# Patient Record
Sex: Male | Born: 1955 | Hispanic: No | Marital: Married | Smoking: Never smoker
Health system: Southern US, Community
[De-identification: ages and names within clinical notes are randomized; demographics above are authoritative.]

## PROBLEM LIST (undated history)

## (undated) DIAGNOSIS — K219 Gastro-esophageal reflux disease without esophagitis: Secondary | ICD-10-CM

## (undated) DIAGNOSIS — R569 Unspecified convulsions: Secondary | ICD-10-CM

## (undated) DIAGNOSIS — H269 Unspecified cataract: Secondary | ICD-10-CM

## (undated) DIAGNOSIS — G709 Myoneural disorder, unspecified: Secondary | ICD-10-CM

## (undated) DIAGNOSIS — G4730 Sleep apnea, unspecified: Secondary | ICD-10-CM

## (undated) DIAGNOSIS — M1990 Unspecified osteoarthritis, unspecified site: Secondary | ICD-10-CM

## (undated) DIAGNOSIS — R011 Cardiac murmur, unspecified: Secondary | ICD-10-CM

## (undated) HISTORY — DX: Sleep apnea, unspecified: G47.30

## (undated) HISTORY — DX: Cardiac murmur, unspecified: R01.1

## (undated) HISTORY — PX: HERNIA REPAIR: SHX51

## (undated) HISTORY — DX: Gastro-esophageal reflux disease without esophagitis: K21.9

## (undated) HISTORY — PX: KNEE SURGERY: SHX244

## (undated) HISTORY — DX: Unspecified cataract: H26.9

## (undated) HISTORY — DX: Unspecified convulsions: R56.9

## (undated) HISTORY — DX: Myoneural disorder, unspecified: G70.9

## (undated) HISTORY — DX: Unspecified osteoarthritis, unspecified site: M19.90

## (undated) HISTORY — PX: POLYPECTOMY: SHX149

---

## 2001-05-13 ENCOUNTER — Encounter: Payer: Self-pay | Admitting: Pulmonary Disease

## 2001-06-16 ENCOUNTER — Encounter: Payer: Self-pay | Admitting: Pulmonary Disease

## 2001-07-04 ENCOUNTER — Encounter: Payer: Self-pay | Admitting: Pulmonary Disease

## 2005-06-29 ENCOUNTER — Ambulatory Visit: Payer: Self-pay | Admitting: Gastroenterology

## 2005-07-20 ENCOUNTER — Ambulatory Visit: Payer: Self-pay | Admitting: Gastroenterology

## 2005-07-20 HISTORY — PX: COLONOSCOPY: SHX174

## 2008-02-27 ENCOUNTER — Telehealth (INDEPENDENT_AMBULATORY_CARE_PROVIDER_SITE_OTHER): Payer: Self-pay | Admitting: *Deleted

## 2008-03-16 ENCOUNTER — Ambulatory Visit: Payer: Self-pay | Admitting: Pulmonary Disease

## 2008-03-16 DIAGNOSIS — G4733 Obstructive sleep apnea (adult) (pediatric): Secondary | ICD-10-CM | POA: Insufficient documentation

## 2008-03-16 DIAGNOSIS — Z9889 Other specified postprocedural states: Secondary | ICD-10-CM | POA: Insufficient documentation

## 2008-09-20 ENCOUNTER — Encounter: Payer: Self-pay | Admitting: Pulmonary Disease

## 2008-09-24 ENCOUNTER — Encounter: Payer: Self-pay | Admitting: Pulmonary Disease

## 2009-04-26 ENCOUNTER — Telehealth (INDEPENDENT_AMBULATORY_CARE_PROVIDER_SITE_OTHER): Payer: Self-pay | Admitting: *Deleted

## 2009-04-27 ENCOUNTER — Ambulatory Visit: Payer: Self-pay | Admitting: Cardiology

## 2009-04-27 ENCOUNTER — Ambulatory Visit: Payer: Self-pay

## 2009-04-27 ENCOUNTER — Encounter (HOSPITAL_COMMUNITY): Admission: RE | Admit: 2009-04-27 | Discharge: 2009-06-08 | Payer: Self-pay | Admitting: Internal Medicine

## 2010-04-26 ENCOUNTER — Encounter: Payer: Self-pay | Admitting: Pulmonary Disease

## 2010-06-28 ENCOUNTER — Telehealth (INDEPENDENT_AMBULATORY_CARE_PROVIDER_SITE_OTHER): Payer: Self-pay | Admitting: *Deleted

## 2010-07-11 NOTE — Letter (Signed)
Summary: CMN request for CPAP Supplies/[facility]  CMN request for CPAP Supplies/[facility]   Imported By: Sherian Rein 05/19/2010 12:39:19  _____________________________________________________________________  External Attachment:    Type:   Image     Comment:   External Document

## 2010-07-13 NOTE — Progress Notes (Addendum)
Summary: switch doctors--  Phone Note Call from Patient Call back at [facility] Phone [phone]   Caller: Patient Call For: sood Reason for Call: Talk to Nurse Summary of Call: Patient is asking to switch doctors from Dr. Craige Cotta to Dr .Vassie Loll.  Patient did not have a particular reason why. Initial call taken by: Lehman Prom,  June 28, 2010 2:51 PM  Follow-up for Phone Call        Dr Craige Cotta, pls advise if this is okay with you, thanks Vernie Murders  June 28, 2010 3:48 PM   Additional Follow-up for Phone Call Additional follow up Details #1::        I have not seen Mr. Brizzi since 2009.  That is fine for him to switch to Dr. Vassie Loll. Additional Follow-up by: Coralyn Helling MD,  June 28, 2010 4:00 PM    Additional Follow-up for Phone Call Additional follow up Details #2::    Dr. Vassie Loll please advise if okay with you. Thanks. [name] CMA  June 28, 2010 4:04 PM   Pl let him know that Dr Craige Cotta is an excellent doctor, he only needs once a year FU for his obstructive sleep apnea . No need to switch unless he can provide overriding reason. Follow-up by: Comer Locket Vassie Loll MD,  June 29, 2010 11:09 PM  Additional Follow-up for Phone Call Additional follow up Details #3:: Details for Additional Follow-up Action Taken: [facility] x1. Abigail Miyamoto RN  June 30, 2010 9:32 AM   LMTCB x2 Vernie Murders  July 04, 2010 12:28 PM returnin call .Marland KitchenChantel Bowne  July 04, 2010 4:14 PM  Spoke with pt.  He states he just feels that Dr. Vassie Loll would better serve his needs as a patient.  Dr. Vassie Loll, would you still rather pt stay with [facility]? Gweneth Dimitri RN  July 04, 2010 5:27 PM  YES  Spoke with pt. I advised that RA decided it would be best if he stayed with [facility].  Pt did not understand why.  I advised that there was never any specific reason given for the switch.  He states that he looked on the internet and found out that [facility] was not a sleep specialist.  I advised that this is false  information, I believe there is another MD out there with the name of Coralyn Helling, and advised that maybe he was looking at the wrong physicians info.  He states that this is not the case and still wants to see RA.  I advised that there are other sleep docs here and that maybe I could ask them if they are willing to take him on and he declined stating that his "insurance reccomended Dr Vassie Loll for his specific problem" which apparently is OSA.  He states that he will talk with PCP and be reffered to a different facility. Will forward to [facility] so that he is aware.  Vernie Murders  July 05, 2010 12:51 PM Coralyn Helling MD  July 05, 2010 2:00 PM  Additional Follow-up by: Comer Locket. Vassie Loll MD,  July 05, 2010 9:21 AM  [facility].Michel Bickers [facility]  July 05, 2010 9:37 AM    Appended Document: switch doctors-- d/w  Dr Craige Cotta, I can see this patient & discuss his problem - 15 mins FU OV pl  Appended Document: switch doctors-- lmomtcb  Appended Document: switch doctors-- pt set to see Dr. Vassie Loll monday 07-17-10 at 3:15pm.

## 2010-07-17 ENCOUNTER — Encounter: Payer: Self-pay | Admitting: Pulmonary Disease

## 2010-07-17 ENCOUNTER — Ambulatory Visit (INDEPENDENT_AMBULATORY_CARE_PROVIDER_SITE_OTHER): Payer: BC Managed Care – PPO | Admitting: Pulmonary Disease

## 2010-07-17 DIAGNOSIS — G4733 Obstructive sleep apnea (adult) (pediatric): Secondary | ICD-10-CM

## 2010-07-20 ENCOUNTER — Telehealth: Payer: Self-pay | Admitting: Pulmonary Disease

## 2010-07-25 ENCOUNTER — Telehealth (INDEPENDENT_AMBULATORY_CARE_PROVIDER_SITE_OTHER): Payer: Self-pay | Admitting: *Deleted

## 2010-07-27 NOTE — Progress Notes (Signed)
  Phone Note Call from Patient   Caller: [email or web address] Call For: Alexander George Summary of Call: pt was there for mask fit and stated his pressure on 6-12 is not enough can you send [facility] an order to start it@7 -12 Initial call taken by: Oneita Jolly,  July 20, 2010 3:32 PM

## 2010-07-27 NOTE — Assessment & Plan Note (Signed)
Summary: sleep apnea//switch from Dr. Edrick Oh   Primary Provider/Referring Provider:  Mila Homer  CC:  Pt changed from [facility] due to insurance .  History of Present Illness: Alexander George, for evaluation of obstructive sleep apnea . He was diagnosed with sleep apnea in 2003.  His RDI was 21 with a REM RDI of 57.  His oxygen nadir was 76%.  He has been on CPAP since then.  He uses nasal pillows and has a humidifer. He has a rotating shift with his work, and as a result sleeps at different times of the day.  He gets about 6 to 7 hours of sleep per day.  Download in 2009 showed residual AHI of 13/h  July 17, 2010 3:44 PM  Switching doctors from VS to me  Poor compliance with CPAP-  he is frustrated that he has tihs problem even though he is not obese, wife has continued to observe loud snoring & witnessed apneas , even when he sleeps in a different room. Download shows AHI of 15/ h on pr of 6 cm, poor usage. he does not use sleep aids & denies excessive daytime somnolence , other than when he changes shifts. Did not tolerate full face mask, has never tried nasal mask There is no history suggestive of cataplexy, sleep paralysis or parasomnias    Preventive Screening-Counseling & Management  Alcohol-Tobacco     Alcohol drinks/day: 0     Smoking Status: never   History of Present Illness: Sleep Apnea  What time do you typically go to bed?(between what hours): varies  How long does it take you to fall asleep? varies (long time with CPAP)  How many times during the night do you wake up? none  What time do you get out of bed to start your day? varies  Do you drive or operate heavy machinery in your occupation? sometimes  How much has your weight changed (up or down) over the past two years? (in pounds): 0  Have you ever had a sleep study before?  If yes,when and where: 2003, [place]  Do you currently use CPAP ? If so , at what pressure? yes, 6  Do you wear oxygen at any time? If yes,  how many liters per minute? no Current Medications (verified): 1)  No Daily Meds 2)  Testosterone .... 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   Social History: Alcohol drinks/day:  0  Review of Systems       The patient complains of acid heartburn, indigestion, nasal congestion/difficulty breathing through nose, hand/feet swelling, and rash.  The patient denies shortness of breath with activity, shortness of breath at rest, productive cough, non-productive cough, coughing up blood, chest pain, irregular heartbeats, loss of appetite, weight change, abdominal pain, difficulty swallowing, sore throat, tooth/dental problems, headaches, sneezing, itching, ear ache, anxiety, depression, joint stiffness or pain, change in color of mucus, and fever.    Vital Signs:  Patient profile:   55 year old male Height:      67 inches Weight:      183 pounds BMI:     28.77 O2 Sat:      96 % on Room air Temp:     98.0 degrees F oral Pulse rate:   78 / minute BP sitting:  120 / 80  (left arm) Cuff size:   regular  Vitals Entered By: [name] CMA (July 17, 2010 3:23 PM)  O2 Flow:  Room air CC: Pt changed from [facility] due to insurance  Comments Medications reviewed with patient Verified contact number and pharmacy with patient [name] [facility]  July 17, 2010 3:23 PM    Physical Exam  Additional Exam:  wt 183 July 17, 2010  Gen. Pleasant, well-nourished, in no distress, normal affect ENT - no lesions, no post nasal drip, class 2 airway, deviated septum Neck: No JVD, no thyromegaly, no carotid bruits Lungs: no use of accessory muscles, no dullness to percussion, clear without rales or rhonchi  Cardiovascular: Rhythm  regular, heart sounds  normal, no murmurs or gallops, no peripheral edema Abdomen: soft and non-tender, no hepatosplenomegaly, BS normal. Musculoskeletal: No deformities, no cyanosis or clubbing     Impression & Recommendations:  Problem # 1:  OBSTRUCTIVE SLEEP APNEA (ICD-327.23) The pathophysiology of obstructive sleep apnea, it's cardiovascular consequences and modes of treatment including CPAP were discussed with the patient in great detail. I feel his obstruction is mainly nasal - but doubt that sweptal surgery wil be curative. Discussed oral appliance but of limited benefit given moderate degree of obstructive sleep apnea  Increase CPAP to auto 6-12 & rechk download, trial of nasal mask with humidity ENcouraged compliance trial of nasal steroids Orders: Est. Patient Level IV (04540) DME Referral (DME)  Medications Added to Medication List This Visit: 1)  Testosterone  .... Im injections  Patient Instructions: 1)  Copy sent to: Dr guest 2)  Please schedule a follow-up appointment in 1 month. 3)  nasal spray each nare at bedtime  4)  Download from CPAP for pressure check 5)  Trial of nasal mask - [id number] - go over to the sleep lab

## 2010-08-08 NOTE — Progress Notes (Signed)
Summary: pt would like the nurse to call him regarding his cpap  Phone Note Call from Patient   Caller: Patient Call For: alva Summary of Call: patient phoned regarding his CPAP machine he would like for the nurse to call him back. He can be reached at [phone]. He isnt able to use it and he has to return to dr Vassie Loll in one month to elvaluate.  Initial call taken by: Vedia Coffer,  July 25, 2010 1:13 PM  Follow-up for Phone Call        Spoke with pt.  He states unable to tolerate CPAP- feels like he is choking and there is too much pressure.  He states that he wanted to know how to adjust his pressure and [facility] advised him would need order from [place] to show him how to do this.  Pls advise thanks Follow-up by: Vernie Murders,  July 25, 2010 2:29 PM  Additional Follow-up for Phone Call Additional follow up Details #1::        order sent to lower pressure Additional Follow-up by: Comer Locket. Vassie Loll MD,  July 25, 2010 3:01 PM    Additional Follow-up for Phone Call Additional follow up Details #2::    Called and spoke with pt and informed him we were sending an order to [facility] to lower pressure. Pt states he does not want his pressure lowered that he just wants [facility] to show him how to adjust his pressure himself on his machine.  Carver Fila  July 25, 2010 3:07 PM   let him know he is at the lowest pressure already on auto setting - the  machine adjusts automatically  he does not have to manually do this. I will call him if he still has questions. Follow-up by: Comer Locket Vassie Loll MD,  July 25, 2010 5:13 PM  Additional Follow-up for Phone Call Additional follow up Details #3:: Details for Additional Follow-up Action Taken: [facility] Vernie Murders  July 25, 2010 5:20 PM [facility] x 2. [name] [facility]  July 28, 2010 3:43 PM  [facility] Gweneth Dimitri RN  July 28, 2010 5:29 PM [email or web address] Carver Fila  July 31, 2010 5:13 PM   Called, spoke with pt.  He was informed of above  per RA.  States he is already aware of this.  States he had another questiongs but answered it by himself by googling it.  States he has no problems anymore.  No further questions.  Asked if he would like to speak to RA personally, he said no he has nothing else to ask. Additional Follow-up by: Gweneth Dimitri RN,  July 31, 2010 5:21 PM

## 2010-08-24 ENCOUNTER — Ambulatory Visit (INDEPENDENT_AMBULATORY_CARE_PROVIDER_SITE_OTHER): Payer: BC Managed Care – PPO | Admitting: Pulmonary Disease

## 2010-08-24 ENCOUNTER — Encounter: Payer: Self-pay | Admitting: Pulmonary Disease

## 2010-08-24 DIAGNOSIS — G4733 Obstructive sleep apnea (adult) (pediatric): Secondary | ICD-10-CM

## 2010-08-29 NOTE — Assessment & Plan Note (Signed)
Summary: 1 month rov//sh   Visit Type:  Follow-up Primary Provider/Referring Provider:  Mila Homer  CC:  OSA follow up.Marland Kitchen  History of Present Illness: 55/M, for evaluation of obstructive sleep apnea . He was diagnosed with sleep apnea in 2003.  His RDI was 21 with a REM RDI of 57.  His oxygen nadir was 76%.  He has been on CPAP since then.  He uses nasal pillows and has a humidifer. He has a rotating shift with his work, and as a result sleeps at different times of the day.  He gets about 6 to 7 hours of sleep per day.  July 17, 2010 3:44 PM  Switching doctors from [facility] to me  Poor compliance with CPAP-  he is frustrated that he has tihs problem even though he is not obese, wife has continued to observe loud snoring & witnessed apneas , even when he sleeps in a different room. Download shows AHI of 15/ h on pr of 6 cm, poor usage.   August 24, 2010 3:08 PM  Compliance poor, auto CPAP download shows >> mean pr 10 cm. he does not use sleep aids & denies excessive daytime somnolence , other than when he changes shifts. Did not tolerate full face mask, has never tried nasal mask There is no history suggestive of cataplexy, sleep paralysis or parasomnias    Preventive Screening-Counseling & Management  Alcohol-Tobacco     Alcohol drinks/day: 0     Smoking Status: never  Caffeine-Diet-Exercise     Exercise (avg: min/session): 10:31  Current Medications (verified): 1)  No Daily Meds 2)  Testosterone .... 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   Review of Systems  The patient denies anorexia, fever, weight loss, weight gain, vision loss, decreased hearing, hoarseness, chest pain, syncope, dyspnea on exertion,  peripheral edema, prolonged cough, headaches, hemoptysis, abdominal pain, melena, hematochezia, severe indigestion/heartburn, hematuria, muscle weakness, suspicious skin lesions, transient blindness, difficulty walking, depression, unusual weight change, abnormal bleeding, enlarged lymph nodes, and angioedema.    Vital Signs:  Patient profile:   55 year old male Height:      67 inches Weight:      179 pounds BMI:     28.14 O2 Sat:      96 % on Room air Temp:     98.1 degrees F oral Pulse rate:   78 / minute BP sitting:   102 / 60  (left arm) Cuff size:   regular  Vitals Entered By: [name] CMA (August 24, 2010 2:35 PM)  O2 Flow:  Room air CC: OSA follow up. Comments Medications reviewed with patient Verified contact number and pharmacy with patient [name] [facility]  August 24, 2010 2:37 PM    Physical Exam  Additional Exam:  wt 183 July 17, 2010 >> 179 August 25, 2010  Gen. Pleasant, well-nourished, in no distress, normal affect ENT - no lesions, no post nasal drip, class 2 airway, deviated septum Neck: No JVD, no thyromegaly, no carotid bruits Lungs: no use of accessory muscles, no dullness to percussion, clear without rales or rhonchi  Cardiovascular: Rhythm regular, heart sounds  normal, no murmurs or gallops, no peripheral edema Abdomen: soft and non-tender, no hepatosplenomegaly, BS normal. Musculoskeletal: No deformities, no cyanosis or clubbing  Impression & Recommendations:  Problem # 1:  OBSTRUCTIVE SLEEP APNEA (ICD-327.23)  I spent some time with him addressing his frustartio with CPPA & discussing alternatives I feel his obstruction is mainly nasal - but doubt that sweptal surgery wil be curative. Discussed oral appliance &have saked him to conatact Dr Myrtis Ser to discuss how to proceed  cost involved Increase CPAP to 8 cm - will accept residual AHIs on this ENcouraged compliance  Orders: Est. Patient Level III (04540)  Patient  Instructions: 1)  Copy sent to: 2)  Please schedule a follow-up appointment in 6 months. 3)  Persist with CPAP 8 cm  4)  Meet  with Dr Myrtis Ser - dental specialist for obstructive sleep apnea  5)  Call me if you have this made

## 2010-09-01 ENCOUNTER — Encounter: Payer: Self-pay | Admitting: Pulmonary Disease

## 2010-12-26 ENCOUNTER — Telehealth: Payer: Self-pay | Admitting: Pulmonary Disease

## 2010-12-26 NOTE — Telephone Encounter (Signed)
His dentist will have to do get authorisation.We do not  do this .

## 2010-12-26 NOTE — Telephone Encounter (Signed)
Pt says he wants to proceed with getting a dental appliance but says we need to get authorization through his insurance company first because this will fall under his medical and not dental plan. Pls advise.

## 2010-12-26 NOTE — Telephone Encounter (Signed)
Spoke with pt and notified of recs per RA. He does not seem to understanding. He states that his insurance co said to call here, b/c the appliance is not considered dental, it is medical. He states that he never even was seen by the dentist. He was not making sense. I tried explaining to him again to have dentist contact ins. He does not understand. RA, pls advise thanks

## 2010-12-29 NOTE — Telephone Encounter (Signed)
Dr. Vassie Loll spoke with the pt and explained procedure.Carron Curie, CMA

## 2010-12-29 NOTE — Telephone Encounter (Signed)
I have tried to reach him at the home no.. Pl have him leave a cell no where I can reach him.

## 2011-01-05 ENCOUNTER — Other Ambulatory Visit: Payer: Self-pay | Admitting: Emergency Medicine

## 2011-01-05 DIAGNOSIS — R590 Localized enlarged lymph nodes: Secondary | ICD-10-CM

## 2011-01-08 ENCOUNTER — Other Ambulatory Visit: Payer: BC Managed Care – PPO

## 2011-01-08 ENCOUNTER — Telehealth: Payer: Self-pay | Admitting: Pulmonary Disease

## 2011-01-08 NOTE — Telephone Encounter (Signed)
Spoke to pt and he states 10 days ago dr Vassie Loll called him and told him he was sending over paperwork to dr Myrtis Ser for his appt for a dental appliance, pt had his appt today and dr Myrtis Ser did not have any info--please advise about what needs to be done or what records need to be sent.

## 2011-01-08 NOTE — Telephone Encounter (Signed)
Last ov note and PSG were faxed to Katy's attn at the fax number provided.

## 2011-01-08 NOTE — Telephone Encounter (Signed)
Pl send last office note & sleep study results

## 2011-01-09 NOTE — Telephone Encounter (Signed)
LMTCB

## 2011-01-09 NOTE — Telephone Encounter (Signed)
There was not an order or referral in epic for pt to see Dr. Myrtis Ser. That's why referral wasn't faxed to Dr. Henrietta Hoover office. Hand wrote referral and faxed records to Dr. Myrtis Ser.

## 2011-01-11 NOTE — Telephone Encounter (Signed)
lmomtcb  

## 2011-01-12 NOTE — Telephone Encounter (Signed)
Pt return call. Krista L Lilly  °

## 2011-01-12 NOTE — Telephone Encounter (Signed)
LMTCBx1.Jennifer Castillo, CMA  

## 2011-01-15 NOTE — Telephone Encounter (Signed)
Everything is taken care of for this pt and he is not looking for anything further this was about the ref. To dr Myrtis Ser and this has been done

## 2011-04-26 ENCOUNTER — Other Ambulatory Visit: Payer: Self-pay | Admitting: Emergency Medicine

## 2011-04-26 DIAGNOSIS — R221 Localized swelling, mass and lump, neck: Secondary | ICD-10-CM

## 2011-05-01 ENCOUNTER — Ambulatory Visit
Admission: RE | Admit: 2011-05-01 | Discharge: 2011-05-01 | Disposition: A | Discharge status: Home / Self Care | Payer: BC Managed Care – PPO | Source: Ambulatory Visit | Attending: Emergency Medicine | Admitting: Emergency Medicine

## 2011-05-01 DIAGNOSIS — R221 Localized swelling, mass and lump, neck: Secondary | ICD-10-CM

## 2011-06-13 ENCOUNTER — Ambulatory Visit
Admission: RE | Admit: 2011-06-13 | Discharge: 2011-06-13 | Disposition: A | Discharge status: Home / Self Care | Payer: BC Managed Care – PPO | Source: Ambulatory Visit | Attending: Emergency Medicine | Admitting: Emergency Medicine

## 2011-06-13 MED ORDER — IOHEXOL 300 MG/ML  SOLN
75.0000 mL | Freq: Once | INTRAMUSCULAR | Status: AC | PRN
  Administered 2011-06-13: 75 mL via INTRAVENOUS

## 2011-06-28 ENCOUNTER — Ambulatory Visit (INDEPENDENT_AMBULATORY_CARE_PROVIDER_SITE_OTHER): Payer: BC Managed Care – PPO

## 2011-06-28 DIAGNOSIS — IMO0002 Reserved for concepts with insufficient information to code with codable children: Secondary | ICD-10-CM

## 2011-06-28 DIAGNOSIS — M542 Cervicalgia: Secondary | ICD-10-CM

## 2012-04-10 ENCOUNTER — Ambulatory Visit (INDEPENDENT_AMBULATORY_CARE_PROVIDER_SITE_OTHER): Payer: BC Managed Care – PPO | Admitting: Emergency Medicine

## 2012-04-10 VITALS — BP 124/70 | HR 69 | Temp 98.4°F | Resp 16 | Ht 68.8 in | Wt 177.0 lb

## 2012-04-10 DIAGNOSIS — M542 Cervicalgia: Secondary | ICD-10-CM

## 2012-04-10 NOTE — Progress Notes (Signed)
  Subjective:    Patient ID: Alexander George, male    DOB: [birth year], 56 y.o.   MRN: [id number]  HPI is a long history of neck problems. He is having pain in his neck with radicular symptoms down his left arm involving his index finger of the left hand. He notes that when he extends his neck he has significant discomfort in    Review of Systems     Objective:   Physical Exam is no tenderness over the C-spine. There is discomfort with extension of the neck. Deep tender reflexes are 2+ in the biceps trace brachioradialis 2+ triceps        Assessment & Plan:  Patient had radicular symptoms down his left arm. He requests referral to a specialist to evaluate this. Referral made to [facility] the neurosurgical to Dr. Lovell Sheehan to evaluate this

## 2012-05-21 ENCOUNTER — Other Ambulatory Visit (HOSPITAL_COMMUNITY): Payer: Self-pay | Admitting: Neurosurgery

## 2012-05-21 DIAGNOSIS — M542 Cervicalgia: Secondary | ICD-10-CM

## 2012-05-22 ENCOUNTER — Ambulatory Visit (HOSPITAL_COMMUNITY): Payer: BC Managed Care – PPO

## 2012-05-27 ENCOUNTER — Ambulatory Visit (HOSPITAL_COMMUNITY)
Admission: RE | Admit: 2012-05-27 | Discharge: 2012-05-27 | Disposition: A | Discharge status: Home / Self Care | Payer: BC Managed Care – PPO | Source: Ambulatory Visit | Attending: Neurosurgery | Admitting: Neurosurgery

## 2012-05-27 DIAGNOSIS — M542 Cervicalgia: Secondary | ICD-10-CM

## 2012-05-27 DIAGNOSIS — M47812 Spondylosis without myelopathy or radiculopathy, cervical region: Secondary | ICD-10-CM | POA: Insufficient documentation

## 2012-05-27 DIAGNOSIS — M4802 Spinal stenosis, cervical region: Secondary | ICD-10-CM | POA: Insufficient documentation

## 2012-05-27 DIAGNOSIS — M5412 Radiculopathy, cervical region: Secondary | ICD-10-CM | POA: Insufficient documentation

## 2013-04-02 ENCOUNTER — Ambulatory Visit: Payer: BC Managed Care – PPO

## 2013-04-02 ENCOUNTER — Ambulatory Visit (INDEPENDENT_AMBULATORY_CARE_PROVIDER_SITE_OTHER): Payer: BC Managed Care – PPO | Admitting: Emergency Medicine

## 2013-04-02 VITALS — BP 104/62 | HR 74 | Temp 98.5°F | Resp 18 | Ht 68.5 in | Wt 178.2 lb

## 2013-04-02 DIAGNOSIS — R21 Rash and other nonspecific skin eruption: Secondary | ICD-10-CM

## 2013-04-02 DIAGNOSIS — R0789 Other chest pain: Secondary | ICD-10-CM

## 2013-04-02 DIAGNOSIS — Z23 Encounter for immunization: Secondary | ICD-10-CM

## 2013-04-02 DIAGNOSIS — R1310 Dysphagia, unspecified: Secondary | ICD-10-CM

## 2013-04-02 MED ORDER — BETAMETHASONE DIPROPIONATE AUG 0.05 % EX CREA: TOPICAL_CREAM | Freq: Two times a day (BID) | CUTANEOUS | Status: DC

## 2013-04-02 MED ORDER — OMEPRAZOLE 40 MG PO CPDR: 40.0000 mg | DELAYED_RELEASE_CAPSULE | Freq: Every day | ORAL | Status: DC

## 2013-04-02 NOTE — Progress Notes (Signed)
This chart was scribed for Lesle Chris, MD by Ardelia Mems, Scribe. This patient was seen in room Room 8 and the patient's care was started at 2:17 PM.  Subjective:    Patient ID: Alexander George, male    DOB: 1956-01-06, 57 y.o.   MRN: [id number]  HPI  HPI Comments: Alexander George is a 57 y.o. male who presents to the Emergency Department complaining of 1 month of difficulty swallowing. He states that about 1 month ago, while swallowing toasted bread, he felt food get caught in his upper GI. He states that this caused him significant pain at that time, and that he has had multiple recurrences of similar episodes in the past month. He states that his difficulty swallowing has been worst with very sour and very sweet foods. He denies having particular difficulty with swallowing meats. He denies emesis. He states that he has not had a CXR "in a long time". He also has a red, scaly rash in her perineal area that itches intemittently which he would like to have checked today.   Review of Systems  HENT: Positive for trouble swallowing (pain with swallowing).   Gastrointestinal: Negative for vomiting.       Objective:   Physical Exam chest is clear to auscultation and percussion. Heart regular rate no murmurs or gallops. Abdomen soft nontender. [facility] reading (PRIMARY) by  Dr. Cleta Alberts no acute disease KOH negative      Assessment & Plan:   We'll put on Prilosec 40 mg a day referral made to Dr. Elnoria Howard for consideration for endoscopy. He was given Diprolene AF to use for the rash in his scrotal area. Flu Shot given

## 2013-04-03 ENCOUNTER — Other Ambulatory Visit: Payer: Self-pay | Admitting: *Deleted

## 2013-04-03 DIAGNOSIS — R21 Rash and other nonspecific skin eruption: Secondary | ICD-10-CM

## 2013-04-03 MED ORDER — BETAMETHASONE DIPROPIONATE AUG 0.05 % EX CREA: TOPICAL_CREAM | Freq: Two times a day (BID) | CUTANEOUS | Status: DC

## 2013-05-13 ENCOUNTER — Ambulatory Visit (INDEPENDENT_AMBULATORY_CARE_PROVIDER_SITE_OTHER): Payer: BC Managed Care – PPO | Admitting: Emergency Medicine

## 2013-05-13 ENCOUNTER — Ambulatory Visit: Payer: BC Managed Care – PPO

## 2013-05-13 VITALS — BP 112/66 | HR 74 | Temp 98.1°F | Resp 16 | Ht 69.0 in | Wt 179.2 lb

## 2013-05-13 DIAGNOSIS — M25519 Pain in unspecified shoulder: Secondary | ICD-10-CM

## 2013-05-13 DIAGNOSIS — M25512 Pain in left shoulder: Secondary | ICD-10-CM

## 2013-05-13 MED ORDER — HYDROCODONE-ACETAMINOPHEN 5-325 MG PO TABS: 1.0000 | ORAL_TABLET | Freq: Four times a day (QID) | ORAL | Status: DC | PRN

## 2013-05-13 MED ORDER — PREDNISONE 10 MG PO TABS: ORAL_TABLET | ORAL | Status: DC

## 2013-05-13 NOTE — Progress Notes (Signed)
   Subjective:    Patient ID: Alexander George, male    DOB: 1955-07-20, 57 y.o.   MRN: [id number]  HPI patient has a one week history of pain in his left shoulder. 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 He has no previous history of shoulder problems. He does not feel weak in his arm.    Review of Systems     Objective:   Physical Exam is tenderness over the left shoulder. He has good range of motion of the left shoulder. Motor strength is 5 out of 5. He had no weakness on rotator cuff testing.  [facility] reading (PRIMARY) by  Dr. Cleta Alberts normal        Assessment & Plan:  I suspect this is a bursitis of his left shoulder. we'll treat with prednisone, Vicodin for severe pain. if he continues to have problems we'll try a topical anti-inflammatory. He has had some GI problems will avoid oral nonsteroidal

## 2013-07-27 ENCOUNTER — Ambulatory Visit (INDEPENDENT_AMBULATORY_CARE_PROVIDER_SITE_OTHER): Payer: BC Managed Care – PPO | Admitting: Emergency Medicine

## 2013-07-27 ENCOUNTER — Ambulatory Visit: Payer: BC Managed Care – PPO

## 2013-07-27 VITALS — BP 100/60 | HR 66 | Temp 97.8°F | Resp 16 | Ht 68.5 in | Wt 175.0 lb

## 2013-07-27 DIAGNOSIS — M549 Dorsalgia, unspecified: Secondary | ICD-10-CM

## 2013-07-27 LAB — POCT URINALYSIS DIPSTICK
Bilirubin, UA: NEGATIVE
Blood, UA: NEGATIVE
Glucose, UA: NEGATIVE
LEUKOCYTES UA: NEGATIVE
Nitrite, UA: NEGATIVE
PH UA: 5.5
Protein, UA: NEGATIVE
Spec Grav, UA: >=1.03
Urobilinogen, UA: 0.2

## 2013-07-27 LAB — POCT UA - MICROSCOPIC ONLY
Bacteria, U Microscopic: NEGATIVE
CRYSTALS, UR, HPF, POC: NEGATIVE
Casts, Ur, LPF, POC: NEGATIVE
MUCUS UA: NEGATIVE
RBC, urine, microscopic: NEGATIVE
YEAST UA: NEGATIVE

## 2013-07-27 MED ORDER — CYCLOBENZAPRINE HCL 10 MG PO TABS: ORAL_TABLET | ORAL | Status: DC

## 2013-07-27 NOTE — Progress Notes (Signed)
   Subjective:    Patient ID: Alexander George, male    DOB: [birth year], 58 y.o.   MRN: [id number]  HPI 58 y.o. Male presents to clinic with right side back pain that started last week. Denies any trouble with breathing or when urinating. Unable to remember if he lifted something that strained himself. Reports that he does a lot of lifting, bending , crawling at work. Has some pain when reaching above head and stretching.   Review of Systems     Objective:   Physical Exam patient is alert and cooperative he does not appear in distress. His chest was clear there is tenderness along the right lower thoracic paraspinal muscles. There is some tenderness just below the right scapula. There is no tenderness over the lower lumbar spine and no CVA pain.  Results for orders placed in visit on 07/27/13  POCT URINALYSIS DIPSTICK      Result Value Ref Range   Color, UA yellow     Clarity, UA clear     Glucose, UA neg     Bilirubin, UA neg     Ketones, UA trace     Spec Grav, UA >=1.030     Blood, UA neg     pH, UA 5.5     Protein, UA neg     Urobilinogen, UA 0.2     Nitrite, UA neg     Leukocytes, UA Negative    POCT UA - MICROSCOPIC ONLY      Result Value Ref Range   WBC, Ur, HPF, POC 0-1     RBC, urine, microscopic neg     Bacteria, U Microscopic neg     Mucus, UA neg     Epithelial cells, urine per micros 0-1     Crystals, Ur, HPF, POC neg     Casts, Ur, LPF, POC neg     Yeast, UA neg     [facility] reading (PRIMARY) by  Dr. Everlene Farrier  chest x-rays no acute disease there is degenerative disc disease L4-5 and L5-S1        Assessment & Plan:   I feel this is musculoskeletal pain. His urine is completely normal. Patient does not want to take a nonsteroidal or Tylenol so we'll treat with a muscle relaxant

## 2013-07-27 NOTE — Patient Instructions (Signed)
Back Pain, Adult Low back pain is very common. About 1 in 5 people have back pain.The cause of low back pain is rarely dangerous. The pain often gets better over time.About half of people with a sudden onset of back pain feel better in just 2 weeks. About 8 in 10 people feel better by 6 weeks.  CAUSES Some common causes of back pain include:  Strain of the muscles or ligaments supporting the spine.  Wear and tear (degeneration) of the spinal discs.  Arthritis.  Direct injury to the back. DIAGNOSIS Most of the time, the direct cause of low back pain is not known.However, back pain can be treated effectively even when the exact cause of the pain is unknown.Answering your caregiver's questions about your overall health and symptoms is one of the most accurate ways to make sure the cause of your pain is not dangerous. If your caregiver needs more information, he or she may order lab work or imaging tests (X-rays or MRIs).However, even if imaging tests show changes in your back, this usually does not require surgery. HOME CARE INSTRUCTIONS For many people, back pain returns.Since low back pain is rarely dangerous, it is often a condition that people can learn to manageon their own.   Remain active. It is stressful on the back to sit or stand in one place. Do not sit, drive, or stand in one place for more than 30 minutes at a time. Take short walks on level surfaces as soon as pain allows.Try to increase the length of time you walk each day.  Do not stay in bed.Resting more than 1 or 2 days can delay your recovery.  Do not avoid exercise or work.Your body is made to move.It is not dangerous to be active, even though your back may hurt.Your back will likely heal faster if you return to being active before your pain is gone.  Pay attention to your body when you bend and lift. Many people have less discomfortwhen lifting if they bend their knees, keep the load close to their bodies,and  avoid twisting. Often, the most comfortable positions are those that put less stress on your recovering back.  Find a comfortable position to sleep. Use a firm mattress and lie on your side with your knees slightly bent. If you lie on your back, put a pillow under your knees.  Only take over-the-counter or prescription medicines as directed by your caregiver. Over-the-counter medicines to reduce pain and inflammation are often the most helpful.Your caregiver may prescribe muscle relaxant drugs.These medicines help dull your pain so you can more quickly return to your normal activities and healthy exercise.  Put ice on the injured area.  Put ice in a plastic bag.  Place a towel between your skin and the bag.  Leave the ice on for 15-20 minutes, 03-04 times a day for the first 2 to 3 days. After that, ice and heat may be alternated to reduce pain and spasms.  Ask your caregiver about trying back exercises and gentle massage. This may be of some benefit.  Avoid feeling anxious or stressed.Stress increases muscle tension and can worsen back pain.It is important to recognize when you are anxious or stressed and learn ways to manage it.Exercise is a great option. SEEK MEDICAL CARE IF:  You have pain that is not relieved with rest or medicine.  You have pain that does not improve in 1 week.  You have new symptoms.  You are generally not feeling well. SEEK   IMMEDIATE MEDICAL CARE IF:   You have pain that radiates from your back into your legs.  You develop new bowel or bladder control problems.  You have unusual weakness or numbness in your arms or legs.  You develop nausea or vomiting.  You develop abdominal pain.  You feel faint. Document Released: 05/28/2005 Document Revised: 11/27/2011 Document Reviewed: 10/16/2010 Exit[employer] Patient Information 2014 [employer].  

## 2013-07-30 ENCOUNTER — Ambulatory Visit (INDEPENDENT_AMBULATORY_CARE_PROVIDER_SITE_OTHER): Payer: BC Managed Care – PPO | Admitting: Emergency Medicine

## 2013-07-30 VITALS — BP 110/60 | HR 80 | Temp 97.9°F | Resp 16 | Ht 69.5 in | Wt 175.0 lb

## 2013-07-30 DIAGNOSIS — M5137 Other intervertebral disc degeneration, lumbosacral region: Secondary | ICD-10-CM | POA: Insufficient documentation

## 2013-07-30 DIAGNOSIS — K644 Residual hemorrhoidal skin tags: Secondary | ICD-10-CM

## 2013-07-30 DIAGNOSIS — M549 Dorsalgia, unspecified: Secondary | ICD-10-CM

## 2013-07-30 DIAGNOSIS — K649 Unspecified hemorrhoids: Secondary | ICD-10-CM

## 2013-07-30 MED ORDER — DESOXIMETASONE 0.25 % EX OINT: 1.0000 "application " | TOPICAL_OINTMENT | Freq: Two times a day (BID) | CUTANEOUS | Status: DC

## 2013-07-30 NOTE — Progress Notes (Signed)
   Subjective:    Patient ID: Alexander George, male    DOB: 1955-09-20, 58 y.o.   MRN: [id number]  HPI patient is a tender firm area which has been increasing in size at the anus. The area has become increasingly tender and sore. He is up-to-date on his colonoscopy.    Review of Systems     Objective:   Physical Exam there is a 1 a 0.5 x 0.5 cm firm round mass at 3:00. There are no rectal masses felt.        Assessment & Plan:   patient appears to have a thrombosed internal hemorrhoid. Referral made to Gen. Surgery to see about either banding or removal. He is also requesting referral to a back specialist due to his degenerative disc disease and a referral was made to Dr. Rolena Infante with [facility] orthopedics

## 2013-07-30 NOTE — Patient Instructions (Signed)
Hemorrhoids Hemorrhoids are swollen veins around the rectum or anus. There are two types of hemorrhoids:   Internal hemorrhoids. These occur in the veins just inside the rectum. They may poke through to the outside and become irritated and painful.  External hemorrhoids. These occur in the veins outside the anus and can be felt as a painful swelling or hard lump near the anus. CAUSES  Pregnancy.   Obesity.   Constipation or diarrhea.   Straining to have a bowel movement.   Sitting for long periods on the toilet.  Heavy lifting or other activity that caused you to strain.  Anal intercourse. SYMPTOMS   Pain.   Anal itching or irritation.   Rectal bleeding.   Fecal leakage.   Anal swelling.   One or more lumps around the anus.  DIAGNOSIS  Your caregiver may be able to diagnose hemorrhoids by visual examination. Other examinations or tests that may be performed include:   Examination of the rectal area with a gloved hand (digital rectal exam).   Examination of anal canal using a small tube (scope).   A blood test if you have lost a significant amount of blood.  A test to look inside the colon (sigmoidoscopy or colonoscopy). TREATMENT Most hemorrhoids can be treated at home. However, if symptoms do not seem to be getting better or if you have a lot of rectal bleeding, your caregiver may perform a procedure to help make the hemorrhoids get smaller or remove them completely. Possible treatments include:   Placing a rubber band at the base of the hemorrhoid to cut off the circulation (rubber band ligation).   Injecting a chemical to shrink the hemorrhoid (sclerotherapy).   Using a tool to burn the hemorrhoid (infrared light therapy).   Surgically removing the hemorrhoid (hemorrhoidectomy).   Stapling the hemorrhoid to block blood flow to the tissue (hemorrhoid stapling).  HOME CARE INSTRUCTIONS   Eat foods with fiber, such as whole grains, beans,  nuts, fruits, and vegetables. Ask your doctor about taking products with added fiber in them (fibersupplements).  Increase fluid intake. Drink enough water and fluids to keep your urine clear or pale yellow.   Exercise regularly.   Go to the bathroom when you have the urge to have a bowel movement. Do not wait.   Avoid straining to have bowel movements.   Keep the anal area dry and clean. Use wet toilet paper or moist towelettes after a bowel movement.   Medicated creams and suppositories may be used or applied as directed.   Only take over-the-counter or prescription medicines as directed by your caregiver.   Take warm sitz baths for 15 20 minutes, 3 4 times a day to ease pain and discomfort.   Place ice packs on the hemorrhoids if they are tender and swollen. Using ice packs between sitz baths may be helpful.   Put ice in a plastic bag.   Place a towel between your skin and the bag.   Leave the ice on for 15 20 minutes, 3 4 times a day.   Do not use a donut-shaped pillow or sit on the toilet for long periods. This increases blood pooling and pain.  SEEK MEDICAL CARE IF:  You have increasing pain and swelling that is not controlled by treatment or medicine.  You have uncontrolled bleeding.  You have difficulty or you are unable to have a bowel movement.  You have pain or inflammation outside the area of the hemorrhoids. MAKE SURE YOU:    Understand these instructions.  Will watch your condition.  Will get help right away if you are not doing well or get worse. Document Released: 05/25/2000 Document Revised: 05/14/2012 Document Reviewed: 04/01/2012 Exit[employer] Patient Information 2014 [employer].  

## 2013-08-07 ENCOUNTER — Ambulatory Visit (INDEPENDENT_AMBULATORY_CARE_PROVIDER_SITE_OTHER): Payer: BC Managed Care – PPO | Admitting: General Surgery

## 2014-03-04 ENCOUNTER — Other Ambulatory Visit: Payer: Self-pay | Admitting: Emergency Medicine

## 2014-03-15 ENCOUNTER — Other Ambulatory Visit: Payer: Self-pay | Admitting: Emergency Medicine

## 2014-03-15 ENCOUNTER — Ambulatory Visit (INDEPENDENT_AMBULATORY_CARE_PROVIDER_SITE_OTHER): Payer: BC Managed Care – PPO

## 2014-03-15 ENCOUNTER — Ambulatory Visit (INDEPENDENT_AMBULATORY_CARE_PROVIDER_SITE_OTHER): Payer: BC Managed Care – PPO | Admitting: Emergency Medicine

## 2014-03-15 VITALS — BP 128/80 | HR 78 | Temp 97.4°F | Resp 17 | Ht 68.5 in | Wt 173.0 lb

## 2014-03-15 DIAGNOSIS — M25539 Pain in unspecified wrist: Secondary | ICD-10-CM

## 2014-03-15 DIAGNOSIS — R21 Rash and other nonspecific skin eruption: Secondary | ICD-10-CM

## 2014-03-15 DIAGNOSIS — M25529 Pain in unspecified elbow: Secondary | ICD-10-CM

## 2014-03-15 MED ORDER — DICLOFENAC SODIUM 1 % TD GEL: TRANSDERMAL | Status: DC

## 2014-03-15 MED ORDER — BETAMETHASONE DIPROPIONATE AUG 0.05 % EX CREA: TOPICAL_CREAM | Freq: Two times a day (BID) | CUTANEOUS | Status: DC

## 2014-03-15 NOTE — Patient Instructions (Signed)
Lateral Epicondylitis (Tennis Elbow) with Rehab Lateral epicondylitis involves inflammation and pain around the outer portion of the elbow. The pain is caused by inflammation of the tendons in the forearm that bring back (extend) the wrist. Lateral epicondylitis is also called tennis elbow, because it is very common in tennis players. However, it may occur in any individual who extends the wrist repetitively. If lateral epicondylitis is left untreated, it may become a chronic problem. SYMPTOMS   Pain, tenderness, and inflammation on the outer (lateral) side of the elbow.  Pain or weakness with gripping activities.  Pain that increases with wrist-twisting motions (playing tennis, using a screwdriver, opening a door or a jar).  Pain with lifting objects, including a coffee cup. CAUSES  Lateral epicondylitis is caused by inflammation of the tendons that extend the wrist. Causes of injury may include:  Repetitive stress and strain on the muscles and tendons that extend the wrist.  Sudden change in activity level or intensity.  Incorrect grip in racquet sports.  Incorrect grip size of racquet (often too large).  Incorrect hitting position or technique (usually backhand, leading with the elbow).  Using a racket that is too heavy. RISK INCREASES WITH:  Sports or occupations that require repetitive and/or strenuous forearm and wrist movements (tennis, squash, racquetball, carpentry).  Poor wrist and forearm strength and flexibility.  Failure to warm up properly before activity.  Resuming activity before healing, rehabilitation, and conditioning are complete. PREVENTION   Warm up and stretch properly before activity.  Maintain physical fitness:  Strength, flexibility, and endurance.  Cardiovascular fitness.  Wear and use properly fitted equipment.  Learn and use proper technique and have a coach correct improper technique.  Wear a tennis elbow (counterforce) brace. PROGNOSIS    The course of this condition depends on the degree of the injury. If treated properly, acute cases (symptoms lasting less than 4 weeks) are often resolved in 2 to 6 weeks. Chronic (longer lasting cases) often resolve in 3 to 6 months but may require physical therapy. RELATED COMPLICATIONS   Frequently recurring symptoms, resulting in a chronic problem. Properly treating the problem the first time decreases frequency of recurrence.  Chronic inflammation, scarring tendon degeneration, and partial tendon tear, requiring surgery.  Delayed healing or resolution of symptoms. TREATMENT  Treatment first involves the use of ice and medicine to reduce pain and inflammation. Strengthening and stretching exercises may help reduce discomfort if performed regularly. These exercises may be performed at home if the condition is an acute injury. Chronic cases may require a referral to a physical therapist for evaluation and treatment. Your caregiver may advise a corticosteroid injection to help reduce inflammation. Rarely, surgery is needed. MEDICATION  If pain medicine is needed, nonsteroidal anti-inflammatory medicines (aspirin and ibuprofen), or other minor pain relievers (acetaminophen), are often advised.  Do not take pain medicine for 7 days before surgery.  Prescription pain relievers may be given, if your caregiver thinks they are needed. Use only as directed and only as much as you need.  Corticosteroid injections may be recommended. These injections should be reserved only for the most severe cases, because they can only be given a certain number of times. HEAT AND COLD  Cold treatment (icing) should be applied for 10 to 15 minutes every 2 to 3 hours for inflammation and pain, and immediately after activity that aggravates your symptoms. Use ice packs or an ice massage.  Heat treatment may be used before performing stretching and strengthening activities prescribed by your   caregiver, physical  therapist, or athletic trainer. Use a heat pack or a warm water soak. SEEK MEDICAL CARE IF: Symptoms get worse or do not improve in 2 weeks, despite treatment

## 2014-03-15 NOTE — Progress Notes (Signed)
   Subjective:    Patient ID: Alexander George, male    DOB: 1956/03/09, 58 y.o.   MRN: [id number] This chart was scribed for Arlyss Queen, MD by Marti Sleigh, Medical Scribe. This patient was seen in Room 12 and the patient's care was started at 9:54 AM.  Wrist Pain  The pain is present in the left elbow, right elbow, right arm and left arm. This is a new problem. The current episode started more than 1 month ago. There has been no history of extremity trauma. The problem occurs daily. The problem has been waxing and waning. The quality of the pain is described as dull.   HPI Comments: Alexander George is a 58 y.o. male who presents to the Emergency Department complaining of chronic bilateral muscular arm pain that started five years ago. Pt states that the pain started radiating to his wrists within the last several months. Pt states that the pain is worse with lifting.  Pt states that he has a pinched nerve in his neck. Pt states that he is going to a chiropractor for treatment of his neck. Pt denies back pain. Pt uses augmented betamethasone dipropionate cream with for genital itching.. Pt states he needs a refill of his augmented betamethasone dipropionate cream.    Review of Systems  Constitutional: Negative for appetite change and fatigue.  HENT: Negative for congestion, ear discharge and sinus pressure.   Eyes: Negative for discharge.  Respiratory: Negative for cough.   Gastrointestinal: Negative for abdominal pain and diarrhea.  Genitourinary: Negative for frequency and hematuria.  Musculoskeletal: Positive for myalgias and neck pain. Negative for back pain.  Skin: Negative for rash.  Neurological: Negative for seizures and headaches.       Objective:   Physical Exam  Constitutional: He is oriented to person, place, and time. He appears well-developed.  HENT:  Head: Normocephalic.  Eyes: Conjunctivae and EOM are normal. No scleral icterus.  Neck: Neck supple. No thyromegaly  present.  Cardiovascular: Normal rate and regular rhythm.  Exam reveals no gallop and no friction rub.   No murmur heard. Pulmonary/Chest: No stridor. He has no wheezes. He has no rales. He exhibits no tenderness.  Abdominal: He exhibits no distension. There is no tenderness. There is no rebound.  Musculoskeletal: Normal range of motion. He exhibits no edema.       Right shoulder: He exhibits tenderness and pain. He exhibits normal strength.  Mild tenderness of the later epicondyle bilaterally. Pain with extension with resistance. Negative Phalen and tinel's tests.  Lymphadenopathy:    He has no cervical adenopathy.  Neurological: He is oriented to person, place, and time. He exhibits normal muscle tone. Coordination normal.  Skin: No rash noted. No erythema.  Psychiatric: He has a normal mood and affect. His behavior is normal.   [facility] reading (PRIMARY) by  Dr. Everlene Farrier x-rays right elbow and right wrist reveal mild degenerative changes x-rays left elbow and wrist revealed mild degenerative changes otherwise unremarkable.        Assessment & Plan:  Patient given Voltaren gel to use on his elbows and wrists as needed for inflammation. His steroid cream was refilled . I personally performed the services described in this documentation, which was scribed in my presence. The recorded information has been reviewed and is accurate. I personally performed the services described in this documentation, which was scribed in my presence. The recorded information has been reviewed and is accurate.

## 2014-05-20 ENCOUNTER — Telehealth: Payer: Self-pay

## 2014-05-20 NOTE — Telephone Encounter (Signed)
Spoke to patient.  He will come in to get his flu shot.

## 2014-09-15 ENCOUNTER — Ambulatory Visit (INDEPENDENT_AMBULATORY_CARE_PROVIDER_SITE_OTHER): Payer: BLUE CROSS/BLUE SHIELD | Admitting: Emergency Medicine

## 2014-09-15 ENCOUNTER — Ambulatory Visit (INDEPENDENT_AMBULATORY_CARE_PROVIDER_SITE_OTHER): Payer: BLUE CROSS/BLUE SHIELD

## 2014-09-15 ENCOUNTER — Ambulatory Visit: Payer: BLUE CROSS/BLUE SHIELD

## 2014-09-15 ENCOUNTER — Other Ambulatory Visit: Payer: Self-pay

## 2014-09-15 VITALS — BP 122/72 | HR 74 | Temp 98.2°F | Resp 18 | Ht 69.0 in | Wt 171.0 lb

## 2014-09-15 DIAGNOSIS — M25572 Pain in left ankle and joints of left foot: Secondary | ICD-10-CM | POA: Diagnosis not present

## 2014-09-15 DIAGNOSIS — M79641 Pain in right hand: Secondary | ICD-10-CM

## 2014-09-15 DIAGNOSIS — M25562 Pain in left knee: Secondary | ICD-10-CM

## 2014-09-15 DIAGNOSIS — M79642 Pain in left hand: Secondary | ICD-10-CM | POA: Diagnosis not present

## 2014-09-15 MED ORDER — MELOXICAM 7.5 MG PO TABS: 7.5000 mg | ORAL_TABLET | Freq: Every day | ORAL | Status: DC

## 2014-09-15 NOTE — Progress Notes (Addendum)
Subjective:  This chart was scribed for Nena Jordan, MD by Molli Posey, Medical scribe. This patient was seen in ROOM 6 and the patient's care was started 12:46 PM.   Patient ID: Alexander George, male    DOB: 08-20-1955, 59 y.o.   MRN: [id number]   Chief Complaint  Patient presents with  . Knee Pain    both x2 weeks   . Ankle Pain   HPI HPI Comments: Alexander George is a 59 y.o. male with a history of degeneration of lumbar who presents to [facility] complaining of left ankle pain for the last week. Pt denies any injuries. He also complains of left knee pain at this time and states that he experiences some numbness in his left knee. Pt reports his knee and ankle do not lock. He reports he had left knee surgery 6 years ago and that he did well afterwards. He also complains of bilateral hand pain recently but states he has no hand pain at this time. Pt states that he was given a topical steroid cream for similar pain in his wrist in the past which failed to provide relief. Pt reports no history of smoking.   Patient Active Problem List   Diagnosis Date Noted  . Degeneration of lumbar or lumbosacral intervertebral disc 07/30/2013  . OBSTRUCTIVE SLEEP APNEA 03/16/2008  . INGUINAL HERNIORRHAPHY, LEFT, HX OF 03/16/2008   History reviewed. No pertinent past medical history. Current Outpatient Prescriptions on File Prior to Visit  Medication Sig Dispense Refill  . diclofenac sodium (VOLTAREN) 1 % GEL Applied 2 g to both elbows and 2 g to both wrists twice a day 200 g 2  . augmented betamethasone dipropionate (DIPROLENE AF) 0.05 % cream Apply topically 2 (two) times daily. (Patient not taking: Reported on 09/15/2014) 30 g 2   No current facility-administered medications on file prior to visit.   No Known Allergies Past Surgical History  Procedure Laterality Date  . Hernia repair     Family History  Problem Relation Age of Onset  . Diabetes Father   . Cancer Sister    History   Social  History  . Marital Status: Married    Spouse Name: N/A  . Number of Children: N/A  . Years of Education: N/A   Occupational History  . Not on file.   Social History Main Topics  . Smoking status: Never Smoker   . Smokeless tobacco: Not on file  . Alcohol Use: No  . Drug Use: No  . Sexual Activity: Not on file   Other Topics Concern  . Not on file   Social History Narrative     Review of Systems  Musculoskeletal: Positive for arthralgias.  Neurological: Positive for numbness.  All other systems reviewed and are negative.      Objective:   Physical Exam  Constitutional: He is oriented to person, place, and time. He appears well-developed and well-nourished.  HENT:  Head: Normocephalic and atraumatic.  Eyes: Right eye exhibits no discharge. Left eye exhibits no discharge.  Neck: Neck supple. No tracheal deviation present.  Cardiovascular: Normal rate.   Pulmonary/Chest: Effort normal. No respiratory distress.  Abdominal: He exhibits no distension.  Musculoskeletal:  Pt walks with a limp. There is no swelling of the knee or the ankle and no ankle tenderness or knee tenderness.   Neurological: He is alert and oriented to person, place, and time.  Skin: Skin is warm and dry.  Psychiatric: He has a normal mood and affect.  His behavior is normal.  Nursing note and vitals reviewed.  [facility] reading (PRIMARY) by  Dr.Yaniyah Koors there are no abnormalities seen on ankle or knee films. On the hand films there are no definite abnormality seen  Filed Vitals:   09/15/14 1151  BP: 122/72  Pulse: 74  Temp: 98.2 F (36.8 C)  TempSrc: Oral  Resp: 18  Height: 5\' 9"  (1.753 m)  Weight: 171 lb (77.565 kg)  SpO2: 99%      Assessment & Plan:  We'll try patient on Mobic 7.5 one a day for 7 days. I advised him to make an appointment for a complete physical. Will try a lace support for his left ankle. He was advised to make an appointment for a complete physical next door.I personally  performed the services described in this documentation, which was scribed in my presence. The recorded information has been reviewed and is accurate.

## 2014-09-15 NOTE — Patient Instructions (Signed)

## 2014-09-16 ENCOUNTER — Other Ambulatory Visit: Payer: Self-pay

## 2014-09-16 MED ORDER — MELOXICAM 7.5 MG PO TABS: 7.5000 mg | ORAL_TABLET | Freq: Every day | ORAL | Status: DC

## 2014-09-30 ENCOUNTER — Telehealth: Payer: Self-pay

## 2014-09-30 ENCOUNTER — Ambulatory Visit (INDEPENDENT_AMBULATORY_CARE_PROVIDER_SITE_OTHER): Payer: BLUE CROSS/BLUE SHIELD | Admitting: Emergency Medicine

## 2014-09-30 ENCOUNTER — Encounter: Payer: Self-pay | Admitting: Emergency Medicine

## 2014-09-30 VITALS — BP 100/62 | HR 60 | Temp 97.8°F | Resp 16 | Ht 68.5 in | Wt 168.0 lb

## 2014-09-30 DIAGNOSIS — K219 Gastro-esophageal reflux disease without esophagitis: Secondary | ICD-10-CM

## 2014-09-30 DIAGNOSIS — Z0000 Encounter for general adult medical examination without abnormal findings: Secondary | ICD-10-CM

## 2014-09-30 DIAGNOSIS — M5489 Other dorsalgia: Secondary | ICD-10-CM

## 2014-09-30 DIAGNOSIS — R5383 Other fatigue: Secondary | ICD-10-CM | POA: Diagnosis not present

## 2014-09-30 LAB — IFOBT (OCCULT BLOOD): IMMUNOLOGICAL FECAL OCCULT BLOOD TEST: NEGATIVE

## 2014-09-30 LAB — POCT URINALYSIS DIPSTICK
Blood, UA: NEGATIVE
GLUCOSE UA: NEGATIVE
Ketones, UA: NEGATIVE
Leukocytes, UA: NEGATIVE
NITRITE UA: NEGATIVE
Protein, UA: NEGATIVE
Spec Grav, UA: >=1.03
Urobilinogen, UA: 0.2
pH, UA: 5

## 2014-09-30 LAB — CBC
HCT: 43.4 % (ref 39.0–52.0)
Hemoglobin: 14.8 g/dL (ref 13.0–17.0)
MCH: 28.8 pg (ref 26.0–34.0)
MCHC: 34.1 g/dL (ref 30.0–36.0)
MCV: 84.6 fL (ref 78.0–100.0)
MPV: 10.3 fL (ref 8.6–12.4)
PLATELETS: 176 10*3/uL (ref 150–400)
RBC: 5.13 MIL/uL (ref 4.22–5.81)
RDW: 14.2 % (ref 11.5–15.5)
WBC: 4.6 10*3/uL (ref 4.0–10.5)

## 2014-09-30 NOTE — Progress Notes (Addendum)
   Subjective:    Patient ID: Alexander George, male    DOB: 1955-06-25, 59 y.o.   MRN: [id number] This chart was scribed for Arlyss Queen, MD by Zola Button, Medical Scribe. This patient was seen in room 21 and the patient's care was started at 12:50 PM.   HPI HPI Comments: Alexander George is a 59 y.o. male who presents to the [facility] for a complete physical exam.   Patient reports having fatigue which he believes to be due to low testosterone levels. He was on testosterone replacement 3 years ago, but quit due to concerns with the side effects.  He reports having some occasional trouble swallowing foods especially when eating something sour. He denies heartburn/indigestion. Patient states the size of the food does not matter.  Patient notes sometimes having urinary frequency at night on some nights. He denies straining to urinate.  Patient notes some occasional non-radiating right-sided neck pain and cracking to his neck. This is worsened with neck movement.  Patient has not had the flu vaccine this season. He is unsure if he has had the shingles vaccine. His last tetanus shot was in 2010. He does not do any exercise   Patient is originally from [place].  Review of Systems Reviewed per patient health survey     Objective:   Physical Exam CONSTITUTIONAL: Well developed/well nourished HEAD: Normocephalic/atraumatic EYES: EOM/PERRL ENMT: Mucous membranes moist NECK: Mild tenderness to the right side of the neck SPINE: entire spine nontender CV: S1/S2 noted, no murmurs/rubs/gallops noted LUNGS: Lungs are clear to auscultation bilaterally, no apparent distress. Chest is clear. ABDOMEN: soft, nontender, no rebound or guarding GU: no cva tenderness. Prostate normal. NEURO: Pt is awake/alert, moves all extremitiesx4 EXTREMITIES: pulses normal, full ROM SKIN: warm, color normal PSYCH: no abnormalities of mood noted Results for orders placed or performed in visit  on 09/30/14  POCT urinalysis dipstick  Result Value Ref Range   Color, UA Amber    Clarity, UA Clear    Glucose, UA Negative    Bilirubin, UA Small    Ketones, UA negative    Spec Grav, UA >=1.030    Blood, UA Negative    pH, UA 5.0    Protein, UA Negative    Urobilinogen, UA 0.2    Nitrite, UA Negative    Leukocytes, UA Negative   IFOBT POC (occult bld, rslt in office)  Result Value Ref Range   IFOBT Negative           Assessment & Plan:  Patient looks good. He states he is up-to-date on colonoscopy. He has some fatigue probably related to low testosterone. He does not want to take hormone replacement. Routine labs were done today. I told him to look up about Clomid. He did not want a refill on his Voltaren gel.I personally performed the services described in this documentation, which was scribed in my presence. The recorded information has been reviewed and is accurate. Flexeril 5 mg sent in for cervical muscle spasm.

## 2014-09-30 NOTE — Telephone Encounter (Signed)
The patient was seen by Dr. Everlene Farrier today (09/30/14) in clinic.  The patient returned a few hours later to inquire about a  muscle relaxer that he stated Dr. Everlene Farrier was going to prescribe for him.  No medicine was ordered during visit and available dictation did not show this had been done.  Please call patient with update on this medication issue at [phone].

## 2014-09-30 NOTE — Progress Notes (Signed)
   Subjective:    Patient ID: Alexander George, male    DOB: 07-Apr-1956, 59 y.o.   MRN: [id number]  HPI    Review of Systems  Constitutional: Positive for fatigue.  HENT: Positive for trouble swallowing.   Eyes: Negative.   Respiratory: Negative.   Cardiovascular: Negative.   Gastrointestinal: Negative.   Endocrine: Negative.   Genitourinary: Positive for frequency.  Musculoskeletal: Positive for neck pain and neck stiffness.  Skin: Positive for rash.  Allergic/Immunologic: Negative.   Neurological: Positive for numbness.  Hematological: Negative.   Psychiatric/Behavioral: Negative.        Objective:   Physical Exam        Assessment & Plan:

## 2014-10-01 LAB — COMPLETE METABOLIC PANEL WITH GFR
ALBUMIN: 4.2 g/dL (ref 3.5–5.2)
ALK PHOS: 39 U/L (ref 39–117)
ALT: 13 U/L (ref 0–53)
AST: 17 U/L (ref 0–37)
BUN: 19 mg/dL (ref 6–23)
CHLORIDE: 104 meq/L (ref 96–112)
CO2: 26 mEq/L (ref 19–32)
Calcium: 9 mg/dL (ref 8.4–10.5)
Creat: 0.92 mg/dL (ref 0.50–1.35)
GFR, Est African American: >89 mL/min
Glucose, Bld: 81 mg/dL (ref 70–99)
Potassium: 4.7 mEq/L (ref 3.5–5.3)
SODIUM: 139 meq/L (ref 135–145)
TOTAL PROTEIN: 7 g/dL (ref 6.0–8.3)
Total Bilirubin: 0.9 mg/dL (ref 0.2–1.2)

## 2014-10-01 LAB — TESTOSTERONE: TESTOSTERONE: 298 ng/dL — AB (ref 300–890)

## 2014-10-01 LAB — LIPID PANEL
CHOL/HDL RATIO: 2.3 ratio
Cholesterol: 157 mg/dL (ref 0–200)
HDL: 69 mg/dL (ref >=40)
LDL Cholesterol: 75 mg/dL (ref 0–99)
Triglycerides: 64 mg/dL (ref <150)
VLDL: 13 mg/dL (ref 0–40)

## 2014-10-01 LAB — PSA: PSA: 0.56 ng/mL (ref <=4.00)

## 2014-10-01 MED ORDER — CYCLOBENZAPRINE HCL 5 MG PO TABS
5.0000 mg | ORAL_TABLET | Freq: Three times a day (TID) | ORAL | Status: DC | PRN
Start: 2014-10-01 — End: 2017-08-12

## 2014-10-01 NOTE — Telephone Encounter (Signed)
Left message Rx called in

## 2014-10-01 NOTE — Addendum Note (Signed)
Addended by: Arlyss Queen A on: 10/01/2014 09:33 AM   Modules accepted: Orders

## 2014-10-01 NOTE — Telephone Encounter (Signed)
Please call patient and let him know I will send in a muscle relaxant for him to take for his neck discomfort.

## 2014-10-08 ENCOUNTER — Encounter: Payer: Self-pay | Admitting: Family Medicine

## 2014-10-29 ENCOUNTER — Ambulatory Visit (INDEPENDENT_AMBULATORY_CARE_PROVIDER_SITE_OTHER): Payer: BLUE CROSS/BLUE SHIELD | Admitting: Emergency Medicine

## 2014-10-29 VITALS — BP 116/62 | HR 91 | Temp 98.0°F | Resp 18 | Ht 69.5 in | Wt 169.0 lb

## 2014-10-29 DIAGNOSIS — J029 Acute pharyngitis, unspecified: Secondary | ICD-10-CM | POA: Diagnosis not present

## 2014-10-29 DIAGNOSIS — M25572 Pain in left ankle and joints of left foot: Secondary | ICD-10-CM

## 2014-10-29 DIAGNOSIS — R21 Rash and other nonspecific skin eruption: Secondary | ICD-10-CM

## 2014-10-29 DIAGNOSIS — R0981 Nasal congestion: Secondary | ICD-10-CM

## 2014-10-29 LAB — POCT CBC
Granulocyte percent: 77.2 %G (ref 37–80)
HCT, POC: 41.6 % — AB (ref 43.5–53.7)
Hemoglobin: 13.7 g/dL — AB (ref 14.1–18.1)
LYMPH, POC: 1.1 (ref 0.6–3.4)
MCH: 27.6 pg (ref 27–31.2)
MCHC: 32.8 g/dL (ref 31.8–35.4)
MCV: 84 fL (ref 80–97)
MID (CBC): 0.2 (ref 0–0.9)
MPV: 7.4 fL (ref 0–99.8)
POC Granulocyte: 4.2 (ref 2–6.9)
POC LYMPH %: 19.7 % (ref 10–50)
POC MID %: 3.1 %M (ref 0–12)
Platelet Count, POC: 184 10*3/uL (ref 142–424)
RBC: 4.95 M/uL (ref 4.69–6.13)
RDW, POC: 14.4 %
WBC: 5.4 10*3/uL (ref 4.6–10.2)

## 2014-10-29 LAB — POCT INFLUENZA A/B
Influenza A, POC: NEGATIVE
Influenza B, POC: NEGATIVE

## 2014-10-29 LAB — POCT RAPID STREP A (OFFICE): Rapid Strep A Screen: NEGATIVE

## 2014-10-29 MED ORDER — AZELASTINE HCL 0.1 % NA SOLN: 2.0000 | Freq: Two times a day (BID) | NASAL | Status: DC

## 2014-10-29 MED ORDER — BETAMETHASONE DIPROPIONATE AUG 0.05 % EX CREA: TOPICAL_CREAM | Freq: Two times a day (BID) | CUTANEOUS | Status: DC

## 2014-10-29 NOTE — Patient Instructions (Signed)
Upper Respiratory Infection, Adult An upper respiratory infection (URI) is also sometimes known as the common cold. The upper respiratory tract includes the nose, sinuses, throat, trachea, and bronchi. Bronchi are the airways leading to the lungs. Most people improve within 1 week, but symptoms can last up to 2 weeks. A residual cough may last even longer.  CAUSES Many different viruses can infect the tissues lining the upper respiratory tract. The tissues become irritated and inflamed and often become very moist. Mucus production is also common. A cold is contagious. You can easily spread the virus to others by oral contact. This includes kissing, sharing a glass, coughing, or sneezing. Touching your mouth or nose and then touching a surface, which is then touched by another person, can also spread the virus. SYMPTOMS  Symptoms typically develop 1 to 3 days after you come in contact with a cold virus. Symptoms vary from person to person. They may include:  Runny nose.  Sneezing.  Nasal congestion.  Sinus irritation.  Sore throat.  Loss of voice (laryngitis).  Cough.  Fatigue.  Muscle aches.  Loss of appetite.  Headache.  Low-grade fever. DIAGNOSIS  You might diagnose your own cold based on familiar symptoms, since most people get a cold 2 to 3 times a year. Your caregiver can confirm this based on your exam. Most importantly, your caregiver can check that your symptoms are not due to another disease such as strep throat, sinusitis, pneumonia, asthma, or epiglottitis. Blood tests, throat tests, and X-rays are not necessary to diagnose a common cold, but they may sometimes be helpful in excluding other more serious diseases. Your caregiver will decide if any further tests are required. RISKS AND COMPLICATIONS  You may be at risk for a more severe case of the common cold if you smoke cigarettes, have chronic heart disease (such as heart failure) or lung disease (such as asthma), or if  you have a weakened immune system. The very young and very old are also at risk for more serious infections. Bacterial sinusitis, middle ear infections, and bacterial pneumonia can complicate the common cold. The common cold can worsen asthma and chronic obstructive pulmonary disease (COPD). Sometimes, these complications can require emergency medical care and may be life-threatening. PREVENTION  The best way to protect against getting a cold is to practice good hygiene. Avoid oral or hand contact with people with cold symptoms. Wash your hands often if contact occurs. There is no clear evidence that vitamin C, vitamin E, echinacea, or exercise reduces the chance of developing a cold. However, it is always recommended to get plenty of rest and practice good nutrition. TREATMENT  Treatment is directed at relieving symptoms. There is no cure. Antibiotics are not effective, because the infection is caused by a virus, not by bacteria. Treatment may include:  Increased fluid intake. Sports drinks offer valuable electrolytes, sugars, and fluids.  Breathing heated mist or steam (vaporizer or shower).  Eating chicken soup or other clear broths, and maintaining good nutrition.  Getting plenty of rest.  Using gargles or lozenges for comfort.  Controlling fevers with ibuprofen or acetaminophen as directed by your caregiver.  Increasing usage of your inhaler if you have asthma. Zinc gel and zinc lozenges, taken in the first 24 hours of the common cold, can shorten the duration and lessen the severity of symptoms. Pain medicines may help with fever, muscle aches, and throat pain. A variety of non-prescription medicines are available to treat congestion and runny nose. Your caregiver   can make recommendations and may suggest nasal or lung inhalers for other symptoms.  HOME CARE INSTRUCTIONS   Only take over-the-counter or prescription medicines for pain, discomfort, or fever as directed by your  caregiver.  Use a warm mist humidifier or inhale steam from a shower to increase air moisture. This may keep secretions moist and make it easier to breathe.  Drink enough water and fluids to keep your urine clear or pale yellow.  Rest as needed.  Return to work when your temperature has returned to normal or as your caregiver advises. You may need to stay home longer to avoid infecting others. You can also use a face mask and careful hand washing to prevent spread of the virus. SEEK MEDICAL CARE IF:   After the first few days, you feel you are getting worse rather than better.  You need your caregiver's advice about medicines to control symptoms.  You develop chills, worsening shortness of breath, or brown or red sputum. These may be signs of pneumonia.  You develop yellow or brown nasal discharge or pain in the face, especially when you bend forward. These may be signs of sinusitis.  You develop a fever, swollen neck glands, pain with swallowing, or white areas in the back of your throat. These may be signs of strep throat. SEEK IMMEDIATE MEDICAL CARE IF:   You have a fever.  You develop severe or persistent headache, ear pain, sinus pain, or chest pain.  You develop wheezing, a prolonged cough, cough up blood, or have a change in your usual mucus (if you have chronic lung disease).  You develop sore muscles or a stiff neck. Document Released: 11/21/2000 Document Revised: 08/20/2011 Document Reviewed: 09/02/2013 Exit[employer] Patient Information 2015 [employer]. This information is not intended to replace advice given to you by your health care provider. Make sure you discuss any questions you have with your health care provider.  

## 2014-10-29 NOTE — Progress Notes (Signed)
Subjective:    Patient ID: Alexander George, male    DOB: [birth year], 59 y.o.   MRN: [id number]  This chart was scribed for Darlyne Russian, MD by Stephania Fragmin, ED Scribe. This patient was seen in room 2 and the patient's care was started at 10:10 AM.   HPI   Chief Complaint  Patient presents with  . Nasal Congestion    since last night   . Fatigue   HPI Comments: Alexander George is a 59 y.o. male who presents to the [facility] complaining of multiple symptoms that gradually onset last night, first beginning with an itching sore throat, then rhinorrhea, and subsequently severe nasal congestion. He had trouble sleep secondary to his symptoms, and he began to have chills when he woke up this morning. He did not have a flu vaccine this season. He denies any known sick contact. No one else at home is sick. Patient denies any fever or diaphoresis.   Patient also complains of pain and swelling in his left leg that has been ongoing for 7 weeks.   Review of Systems  Constitutional: Positive for chills. Negative for fever and diaphoresis.  HENT: Positive for congestion, rhinorrhea and sore throat.      Objective:   Physical Exam  Nursing note and vitals reviewed. CONSTITUTIONAL: Well developed/well nourished HEAD: Normocephalic/atraumatic EYES: EOMI/PERRL ENMT: Mucous membranes moist, significant clear nasal drainage, throat is red NECK: supple no meningeal signs SPINE/BACK:entire spine nontender CV: S1/S2 noted, no murmurs/rubs/gallops noted LUNGS: Occasional rhonchi in right mid chest ABDOMEN: soft, nontender, no rebound or guarding, bowel sounds noted throughout abdomen GU:no cva tenderness NEURO: Pt is awake/alert/appropriate, moves all extremitiesx4.  No facial droop.   EXTREMITIES: pulses normal/equal, full ROM, there is tenderness just below the medial malleolus of the left foot, with no swelling; no lateral tenderness SKIN: warm, color normal PSYCH: no  abnormalities of mood noted, alert and oriented to situation, alert, cooperative  Results for orders placed or performed in visit on 10/29/14  POCT CBC  Result Value Ref Range   WBC 5.4 4.6 - 10.2 K/uL   Lymph, poc 1.1 0.6 - 3.4   POC LYMPH PERCENT 19.7 10 - 50 %L   MID (cbc) 0.2 0 - 0.9   POC MID % 3.1 0 - 12 %M   POC Granulocyte 4.2 2 - 6.9   Granulocyte percent 77.2 37 - 80 %G   RBC 4.95 4.69 - 6.13 M/uL   Hemoglobin 13.7 (A) 14.1 - 18.1 g/dL   HCT, POC 41.6 (A) 43.5 - 53.7 %   MCV 84.0 80 - 97 fL   MCH, POC 27.6 27 - 31.2 pg   MCHC 32.8 31.8 - 35.4 g/dL   RDW, POC 14.4 %   Platelet Count, POC 184 142 - 424 K/uL   MPV 7.4 0 - 99.8 fL  POCT Influenza A/B  Result Value Ref Range   Influenza A, POC Negative    Influenza B, POC Negative   POCT rapid strep A  Result Value Ref Range   Rapid Strep A Screen Negative Negative       Assessment & Plan:  Advise Tylenol for general discomfort. Will try Zyrtec one a day along with Astepro. I will ask him whether he wants a note for work. His betamethasone cream was refilled. Referral was made to the orthopedist regarding his foot discomfort.I personally performed the services described in this documentation, which was scribed in my presence. The recorded  information has been reviewed and is accurate.  Nena Jordan, MD

## 2015-03-15 ENCOUNTER — Encounter: Payer: Self-pay | Admitting: Emergency Medicine

## 2015-04-27 ENCOUNTER — Ambulatory Visit (INDEPENDENT_AMBULATORY_CARE_PROVIDER_SITE_OTHER): Payer: BLUE CROSS/BLUE SHIELD | Admitting: Emergency Medicine

## 2015-04-27 VITALS — BP 116/68 | HR 71 | Temp 98.1°F | Resp 16 | Ht 69.5 in | Wt 172.8 lb

## 2015-04-27 DIAGNOSIS — Z23 Encounter for immunization: Secondary | ICD-10-CM

## 2015-04-27 DIAGNOSIS — Q159 Congenital malformation of eye, unspecified: Secondary | ICD-10-CM

## 2015-04-27 DIAGNOSIS — R5383 Other fatigue: Secondary | ICD-10-CM | POA: Diagnosis not present

## 2015-04-27 DIAGNOSIS — Z1159 Encounter for screening for other viral diseases: Secondary | ICD-10-CM

## 2015-04-27 DIAGNOSIS — Z114 Encounter for screening for human immunodeficiency virus [HIV]: Secondary | ICD-10-CM | POA: Diagnosis not present

## 2015-04-27 DIAGNOSIS — G4733 Obstructive sleep apnea (adult) (pediatric): Secondary | ICD-10-CM

## 2015-04-27 LAB — POCT CBC
Granulocyte percent: 56.1 %G (ref 37–80)
HEMATOCRIT: 45.2 % (ref 43.5–53.7)
Hemoglobin: 15.5 g/dL (ref 14.1–18.1)
LYMPH, POC: 1.9 (ref 0.6–3.4)
MCH, POC: 29 pg (ref 27–31.2)
MCHC: 34.3 g/dL (ref 31.8–35.4)
MCV: 84.7 fL (ref 80–97)
MID (cbc): 0.2 (ref 0–0.9)
MPV: 7.6 fL (ref 0–99.8)
PLATELET COUNT, POC: 177 10*3/uL (ref 142–424)
POC Granulocyte: 2.7 (ref 2–6.9)
POC LYMPH %: 39.6 % (ref 10–50)
POC MID %: 4.3 %M (ref 0–12)
RBC: 5.34 M/uL (ref 4.69–6.13)
RDW, POC: 14.4 %
WBC: 4.8 10*3/uL (ref 4.6–10.2)

## 2015-04-27 LAB — LIPID PANEL
CHOL/HDL RATIO: 2.5 ratio (ref <=5.0)
CHOLESTEROL: 178 mg/dL (ref 125–200)
HDL: 72 mg/dL (ref >=40)
LDL Cholesterol: 88 mg/dL (ref <130)
TRIGLYCERIDES: 89 mg/dL (ref <150)
VLDL: 18 mg/dL (ref <30)

## 2015-04-27 LAB — TSH: TSH: 1.203 u[IU]/mL (ref 0.350–4.500)

## 2015-04-27 NOTE — Progress Notes (Signed)
This chart was scribed for Alexander Queen, MD by Moises Blood, Medical Scribe. This patient was seen in Room 10 and the patient's care was started 1:23 PM.  Chief Complaint:  Chief Complaint  Patient presents with  . Follow-up    Dr. Everlene Farrier, pt wants to get his cholestrol checked  . Immunizations    pt changed his mind and dose want the flu shot.    HPI: Alexander George is a 59 y.o. male who reports to [facility] today for follow up.  Vision He was at the eye doctor and they found a white spot in his arm. They said it might be due to cholesterol or genetics. His sister was here to visit a few months ago and pt noticed that she had a similar white spot.  Last lipid panel was good:  Lab Results  Component Value Date   CHOL 157 09/30/2014   HDL 69 09/30/2014   LDLCALC 75 09/30/2014   TRIG 64 09/30/2014   CHOLHDL 2.3 09/30/2014   Fatigue He feels fatigue in the morning. He has sleep apnea and uses the machine but does take it off in the middle of he night. He doesn't recall who he saw for sleep study.   Nocturia He's been waking up to urinate in the middle of the night, more often. He states that his stream is still strong. He denies drinking before bed.   Immunizations He also received a flu shot today.   History reviewed. No pertinent past medical history. Past Surgical History  Procedure Laterality Date  . Hernia repair    . Knee surgery Left     6-7 years ago   Social History   Social History  . Marital Status: Married    Spouse Name: N/A  . Number of Children: N/A  . Years of Education: N/A   Social History Main Topics  . Smoking status: Never Smoker   . Smokeless tobacco: None  . Alcohol Use: No  . Drug Use: No  . Sexual Activity: Not Asked   Other Topics Concern  . None   Social History Narrative   Family History  Problem Relation Age of Onset  . Diabetes Father   . Cancer Sister     stomach   No Known Allergies Prior to Admission medications     Medication Sig Start Date End Date Taking? Authorizing Provider  augmented betamethasone dipropionate (DIPROLENE AF) 0.05 % cream Apply topically 2 (two) times daily. 10/29/14   Darlyne Russian, MD  azelastine (ASTELIN) 0.1 % nasal spray Place 2 sprays into both nostrils 2 (two) times daily. Use in each nostril as directed 10/29/14   Darlyne Russian, MD  cyclobenzaprine (FLEXERIL) 5 MG tablet Take 1 tablet (5 mg total) by mouth 3 (three) times daily as needed for muscle spasms. Patient not taking: Reported on 10/29/2014 10/01/14   Darlyne Russian, MD  diclofenac sodium (VOLTAREN) 1 % GEL Applied 2 g to both elbows and 2 g to both wrists twice a day Patient not taking: Reported on 10/29/2014 03/15/14   Darlyne Russian, MD  meloxicam (MOBIC) 7.5 MG tablet Take 1 tablet (7.5 mg total) by mouth daily. Patient not taking: Reported on 09/30/2014 09/16/14   Tereasa Coop, PA-C     ROS:  Constitutional: negative for chills, fever, night sweats, weight changes, or fatigue  HEENT: negative for vision changes, hearing loss, congestion, rhinorrhea, ST, epistaxis, or sinus pressure; positive for white spot in eye Cardiovascular:  negative for chest pain or palpitations Respiratory: negative for hemoptysis, wheezing, shortness of breath, or cough Abdominal: negative for abdominal pain, nausea, vomiting, diarrhea, or constipation Dermatological: negative for rash Neurologic: negative for headache, dizziness, or syncope GU: positive for nocturia All other systems reviewed and are otherwise negative with the exception to those above and in the HPI.  PHYSICAL EXAM: Filed Vitals:   04/27/15 1234  BP: 116/68  Pulse: 71  Temp: 98.1 F (36.7 C)  Resp: 16   Body mass index is 25.16 kg/(m^2).   General: Alert, no acute distress HEENT:  Normocephalic, atraumatic, oropharynx patent. Eye: Alexander George; Faint ring around cornea with bluish white hue bilaterally Cardiovascular:  Regular rate and rhythm, no rubs murmurs  or gallops.  No Carotid bruits, radial pulse intact. No pedal edema.  Respiratory: Clear to auscultation bilaterally.  No wheezes, rales, or rhonchi.  No cyanosis, no use of accessory musculature Abdominal: No organomegaly, abdomen is soft and non-tender, positive bowel sounds. No masses. Musculoskeletal: Gait intact. No edema, tenderness Skin: No rashes. Neurologic: Facial musculature symmetric. Psychiatric: Patient acts appropriately throughout our interaction.  Lymphatic: No cervical or submandibular lymphadenopathy Genitourinary/Anorectal: No acute findings   LABS: Results for orders placed or performed in visit on 04/27/15  POCT CBC  Result Value Ref Range   WBC 4.8 4.6 - 10.2 K/uL   Lymph, poc 1.9 0.6 - 3.4   POC LYMPH PERCENT 39.6 10 - 50 %L   MID (cbc) 0.2 0 - 0.9   POC MID % 4.3 0 - 12 %M   POC Granulocyte 2.7 2 - 6.9   Granulocyte percent 56.1 37 - 80 %G   RBC 5.34 4.69 - 6.13 M/uL   Hemoglobin 15.5 14.1 - 18.1 g/dL   HCT, POC 45.2 43.5 - 53.7 %   MCV 84.7 80 - 97 fL   MCH, POC 29.0 27 - 31.2 pg   MCHC 34.3 31.8 - 35.4 g/dL   RDW, POC 14.4 %   Platelet Count, POC 177 142 - 424 K/uL   MPV 7.6 0 - 99.8 fL    EKG/XRAY:   Primary read interpreted by Dr. Everlene Farrier at [facility].   ASSESSMENT/PLAN: Routine labs were done to include HIV and hep C. He has abnormal corneal clouding and will get ceruloplasmin level. Also check his regular lipid panel. Referral made to Dr. Roddie Mc for her evaluation of his sleep apnea. He has chronic fatigue I suspect because he is not using his CPAP machine appropriately.I personally performed the services described in this documentation, which was scribed in my presence. The recorded information has been reviewed and is accurate.  By signing my name below, I, Moises Blood, attest that this documentation has been prepared under the direction and in the presence of Alexander Queen, MD. Electronically Signed: Moises Blood, [place]. 04/27/2015 , 1:43 PM  .    Gross sideeffects, risk and benefits, and alternatives of medications d/w patient. Patient is aware that all medications have potential sideeffects and we are unable to predict every sideeffect or drug-drug interaction that may occur.  Alexander Queen MD 04/27/2015 1:43 PM

## 2015-04-28 LAB — HEPATITIS C ANTIBODY: HCV Ab: NEGATIVE

## 2015-04-28 LAB — HIV ANTIBODY (ROUTINE TESTING W REFLEX): HIV 1&2 Ab, 4th Generation: NONREACTIVE

## 2015-04-29 LAB — CERULOPLASMIN: Ceruloplasmin: 22 mg/dL (ref 18–36)

## 2015-06-01 ENCOUNTER — Ambulatory Visit: Payer: BLUE CROSS/BLUE SHIELD

## 2015-06-01 ENCOUNTER — Ambulatory Visit (INDEPENDENT_AMBULATORY_CARE_PROVIDER_SITE_OTHER): Payer: BLUE CROSS/BLUE SHIELD | Admitting: Emergency Medicine

## 2015-06-01 ENCOUNTER — Ambulatory Visit (INDEPENDENT_AMBULATORY_CARE_PROVIDER_SITE_OTHER): Payer: BLUE CROSS/BLUE SHIELD

## 2015-06-01 VITALS — BP 118/68 | HR 69 | Temp 98.1°F | Resp 18 | Ht 69.5 in | Wt 171.0 lb

## 2015-06-01 DIAGNOSIS — M25562 Pain in left knee: Secondary | ICD-10-CM | POA: Diagnosis not present

## 2015-06-01 DIAGNOSIS — M25561 Pain in right knee: Secondary | ICD-10-CM | POA: Diagnosis not present

## 2015-06-01 DIAGNOSIS — M25572 Pain in left ankle and joints of left foot: Secondary | ICD-10-CM | POA: Diagnosis not present

## 2015-06-01 DIAGNOSIS — M5010 Cervical disc disorder with radiculopathy, unspecified cervical region: Secondary | ICD-10-CM

## 2015-06-01 DIAGNOSIS — M25529 Pain in unspecified elbow: Secondary | ICD-10-CM | POA: Diagnosis not present

## 2015-06-01 DIAGNOSIS — M25539 Pain in unspecified wrist: Secondary | ICD-10-CM

## 2015-06-01 DIAGNOSIS — R072 Precordial pain: Secondary | ICD-10-CM

## 2015-06-01 MED ORDER — DESOXIMETASONE 0.25 % EX CREA
1.0000 | TOPICAL_CREAM | Freq: Two times a day (BID) | CUTANEOUS | Status: DC
Start: 2015-06-01 — End: 2017-08-12

## 2015-06-01 MED ORDER — GABAPENTIN 100 MG PO CAPS: ORAL_CAPSULE | ORAL | Status: DC

## 2015-06-01 MED ORDER — DICLOFENAC SODIUM 1 % TD GEL: 4.0000 g | Freq: Two times a day (BID) | TRANSDERMAL | Status: DC

## 2015-06-01 NOTE — Progress Notes (Addendum)
Patient ID: Alexander George, male   DOB: 12-12-1955, 59 y.o.   MRN: [id number]    By signing my name below, I, Alexander George, attest that this documentation has been prepared under the direction and in the presence of Darlyne Russian, MD Electronically Signed: Ladene Artist, ED Scribe 06/01/2015 at 11:21 AM.  Chief Complaint:  Chief Complaint  Patient presents with  . Shoulder Pain    left   . Chest Pain  . Ankle Pain    left   . Medication Refill    cream    HPI: Alexander George is a 59 y.o. male who reports to [facility] today complaining of constant, gradually worsening oleft shoulder pain that radiates down into his left hand over the past few days. Pain is exacerbated with movement. He reports associated intermittent numbness in his left index and middle fingers as well as intermittent left-sided chest pain over the past few days. Pt describes chest pain as a pinching sensation. He also reports chronic pinched cervical nerve.   Ankle Pain Pt presents with intermittent left ankle pain over the past few days. He states that his left ankle "gives out" with bearing weight at times. He further reports ambulating with a limp due to severity of pain. No known injury.   History reviewed. No pertinent past medical history. Past Surgical History  Procedure Laterality Date  . Hernia repair    . Knee surgery Left     6-7 years ago   Social History   Social History  . Marital Status: Married    Spouse Name: N/A  . Number of Children: N/A  . Years of Education: N/A   Social History Main Topics  . Smoking status: Never Smoker   . Smokeless tobacco: None  . Alcohol Use: No  . Drug Use: No  . Sexual Activity: Not Asked   Other Topics Concern  . None   Social History Narrative   Family History  Problem Relation Age of Onset  . Diabetes Father   . Cancer Sister     stomach   No Known Allergies Prior to Admission medications   Medication Sig Start Date End Date Taking?  Authorizing Provider  desoximetasone (TOPICORT) 0.25 % cream Apply 1 application topically 2 (two) times daily.   Yes Historical Provider, MD  augmented betamethasone dipropionate (DIPROLENE AF) 0.05 % cream Apply topically 2 (two) times daily. Patient not taking: Reported on 04/27/2015 10/29/14   Darlyne Russian, MD  azelastine (ASTELIN) 0.1 % nasal spray Place 2 sprays into both nostrils 2 (two) times daily. Use in each nostril as directed Patient not taking: Reported on 04/27/2015 10/29/14   Darlyne Russian, MD  cyclobenzaprine (FLEXERIL) 5 MG tablet Take 1 tablet (5 mg total) by mouth 3 (three) times daily as needed for muscle spasms. Patient not taking: Reported on 10/29/2014 10/01/14   Darlyne Russian, MD  diclofenac sodium (VOLTAREN) 1 % GEL Applied 2 g to both elbows and 2 g to both wrists twice a day Patient not taking: Reported on 10/29/2014 03/15/14   Darlyne Russian, MD  meloxicam (MOBIC) 7.5 MG tablet Take 1 tablet (7.5 mg total) by mouth daily. Patient not taking: Reported on 09/30/2014 09/16/14   Tereasa Coop, PA-C   ROS: The patient denies fevers, chills, night sweats, unintentional weight loss, palpitations, wheezing, dyspnea on exertion, nausea, vomiting, abdominal pain, dysuria, hematuria, melena, weakness, or tingling. +chest pain, +arthralgias, +numbness  All other systems have been reviewed and were  otherwise negative with the exception of those mentioned in the HPI and as above.    PHYSICAL EXAM: Filed Vitals:   06/01/15 1022  BP: 118/68  Pulse: 69  Temp: 98.1 F (36.7 C)  Resp: 18   Body mass index is 24.9 kg/(m^2).   General: Alert, no acute distress HEENT:  Normocephalic, atraumatic, oropharynx patent. Eye: EOMI, PEERLDC Neck: Tender along L paracervical muscles. Cardiovascular: Regular rate and rhythm, no rubs murmurs or gallops.  No Carotid bruits, radial pulse intact. No pedal edema.  Respiratory: Clear to auscultation bilaterally.  No wheezes, rales, or rhonchi.  No  cyanosis, no use of accessory musculature Abdominal: No organomegaly, abdomen is soft and non-tender, positive bowel sounds.  No masses. Musculoskeletal: Gait intact. No edema. L ankle: no swelling or tenderness. Good strength.  Skin: No rashes. Neurologic: Facial musculature symmetric. Reflexes and strength are symmetrical.  Psychiatric: Patient acts appropriately throughout our interaction. Lymphatic: No cervical or submandibular lymphadenopathy  LABS:  EKG/XRAY:EKG normal sinus rhythm.   Primary read interpreted by Dr. Everlene Farrier at [facility]. L knee: normal R knee: normal C spine: C5-C6, C6-C7, C7-T1 degenerative disk disease with curved eversion. Foraminal narrowing multilevels bilateral.  Left ankle normal ASSESSMENT/PLAN: Patient has significant cervical degenerative changes. He has mild artery changes of the knees. Will treat with topical diclofenac gel for his knees. He was also started on Neurontin. We'll hold off on MRI of the neck for now.I personally performed the services described in this documentation, which was scribed in my presence. The recorded information has been reviewed and is accurate.    Gross sideeffects, risk and benefits, and alternatives of medications d/w patient. Patient is aware that all medications have potential sideeffects and we are unable to predict every sideeffect or drug-drug interaction that may occur.  Arlyss Queen MD 06/01/2015 10:27 AM

## 2015-06-01 NOTE — Patient Instructions (Signed)

## 2016-05-10 IMAGING — CR DG KNEE COMPLETE 4+V*L*
3 series · 3 of 3 positions shown · non-contrast
Comparison: None.

CLINICAL DATA: Left knee pain.

EXAM:
LEFT KNEE - COMPLETE 4+ VIEW

[AP]
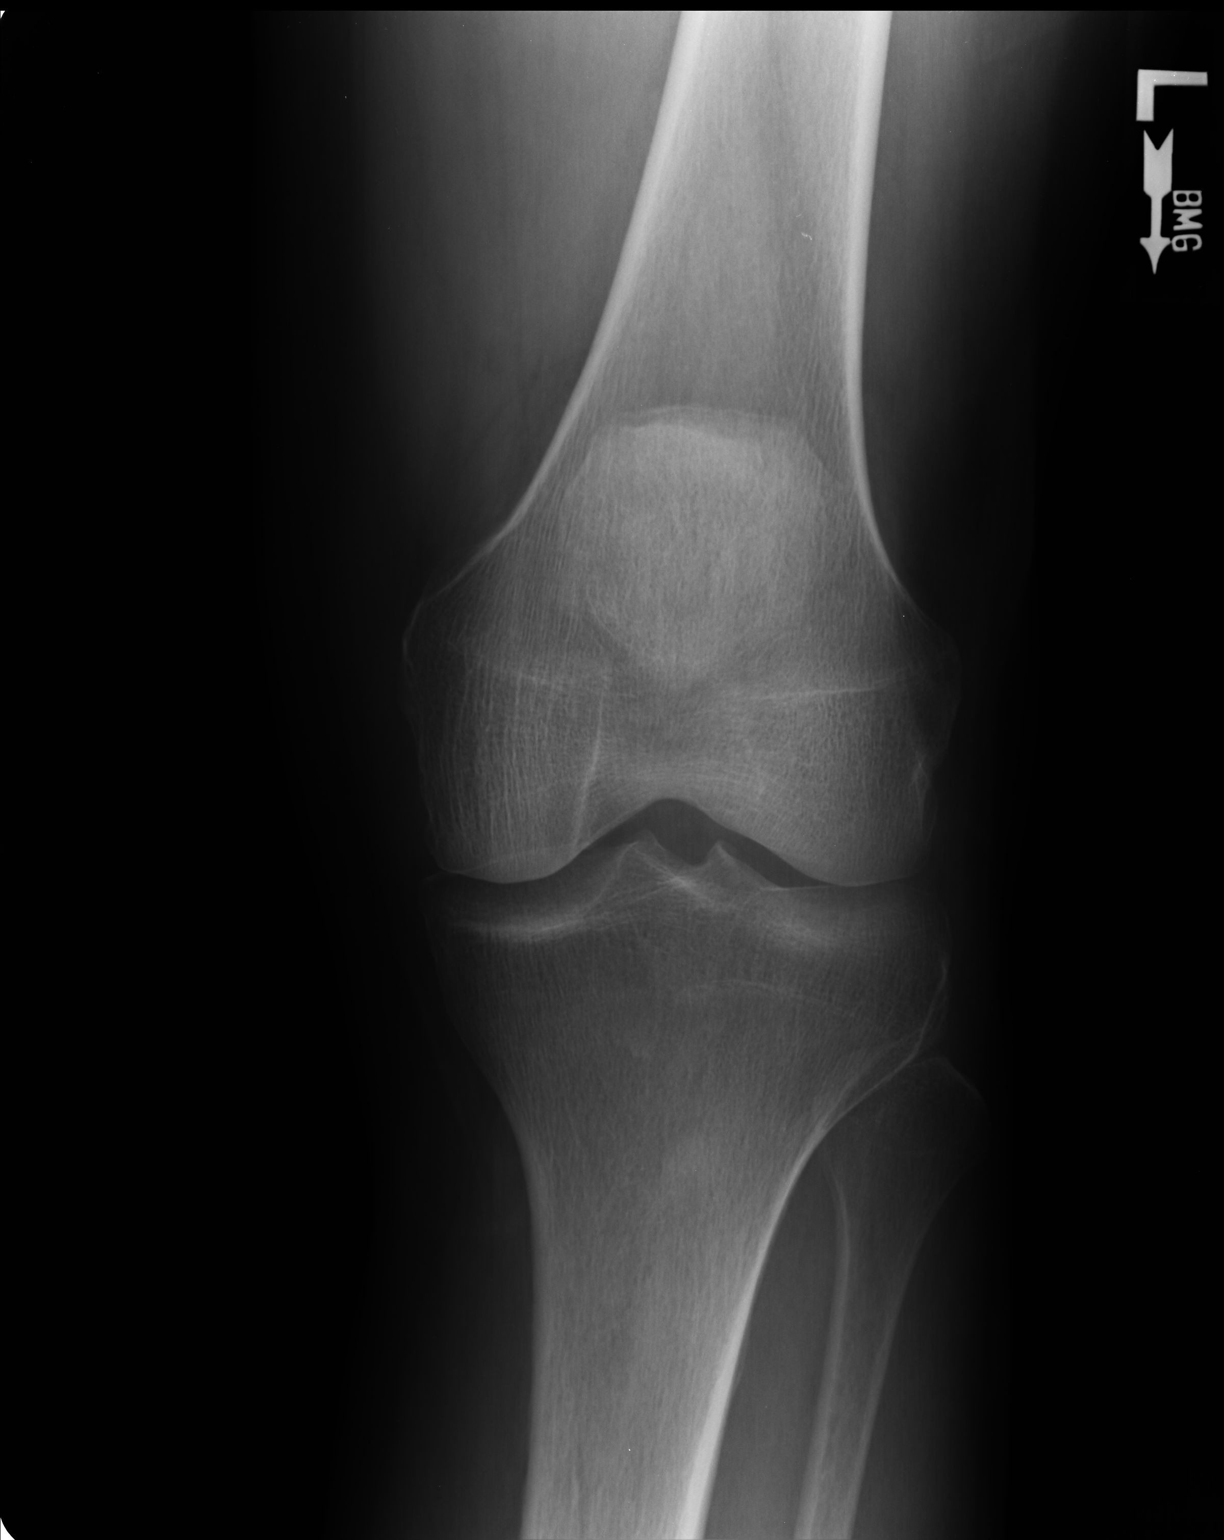

[ap axial]
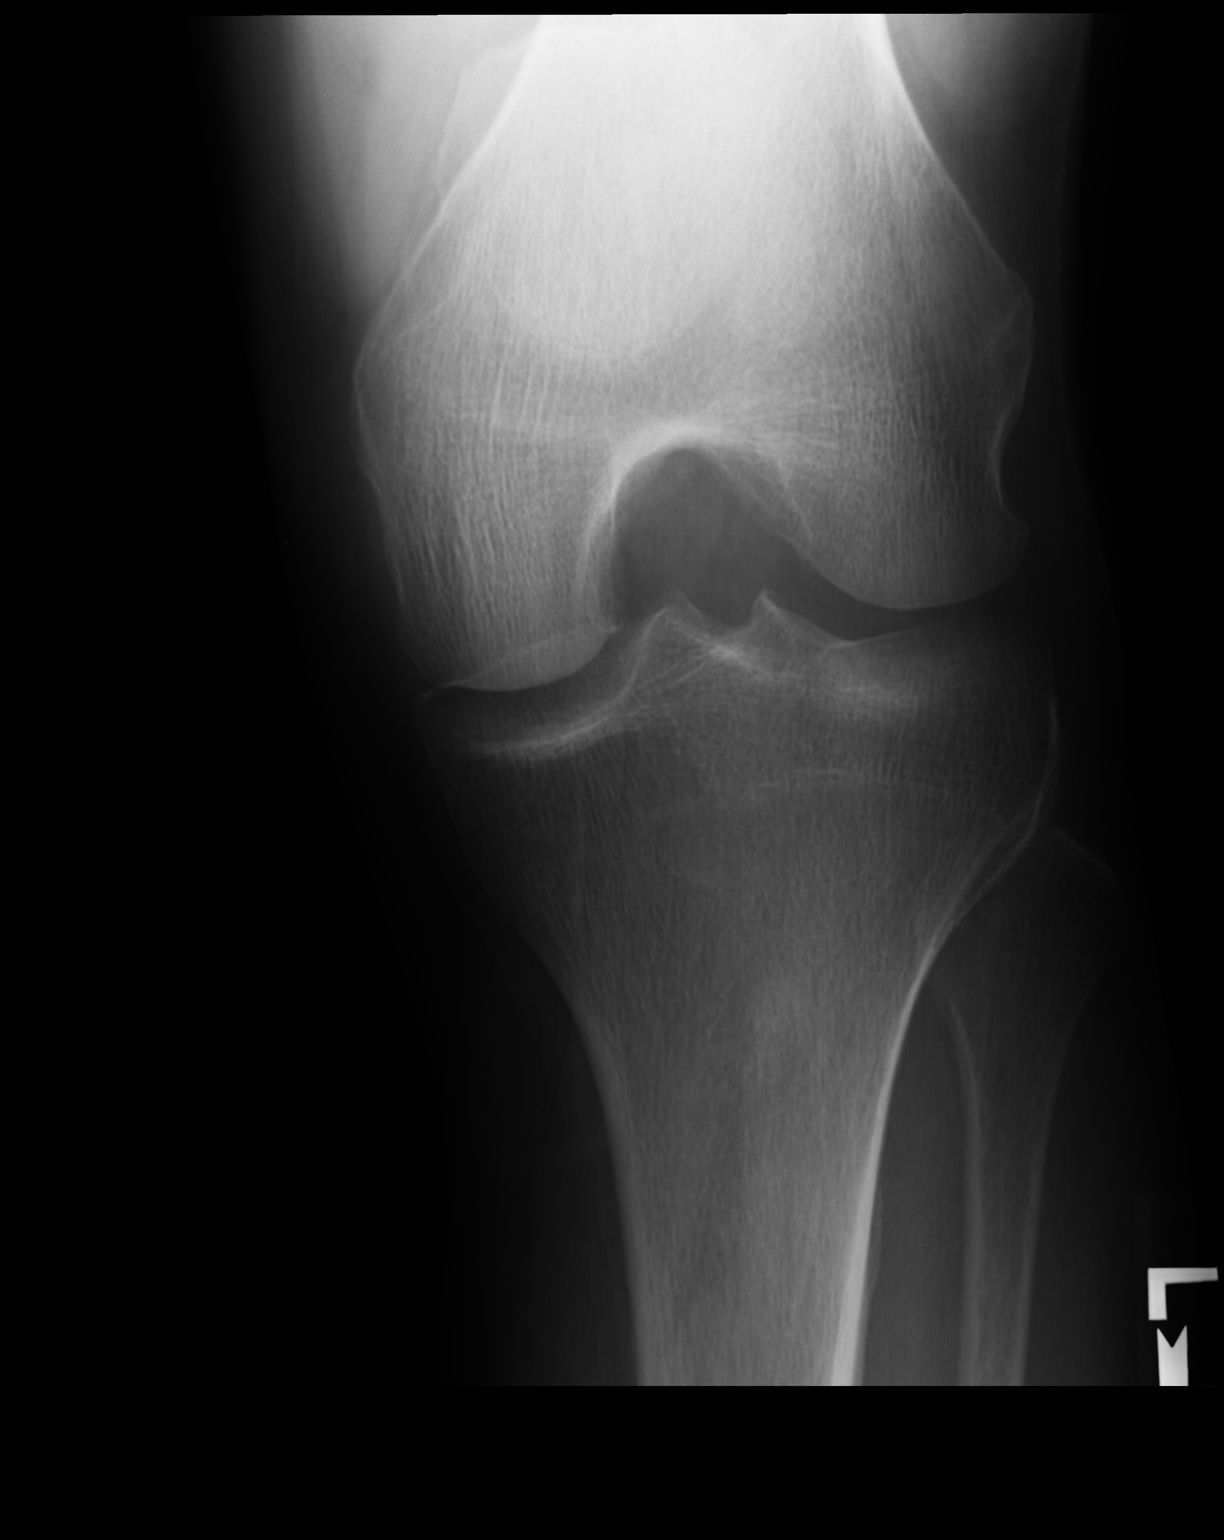

[lateral]
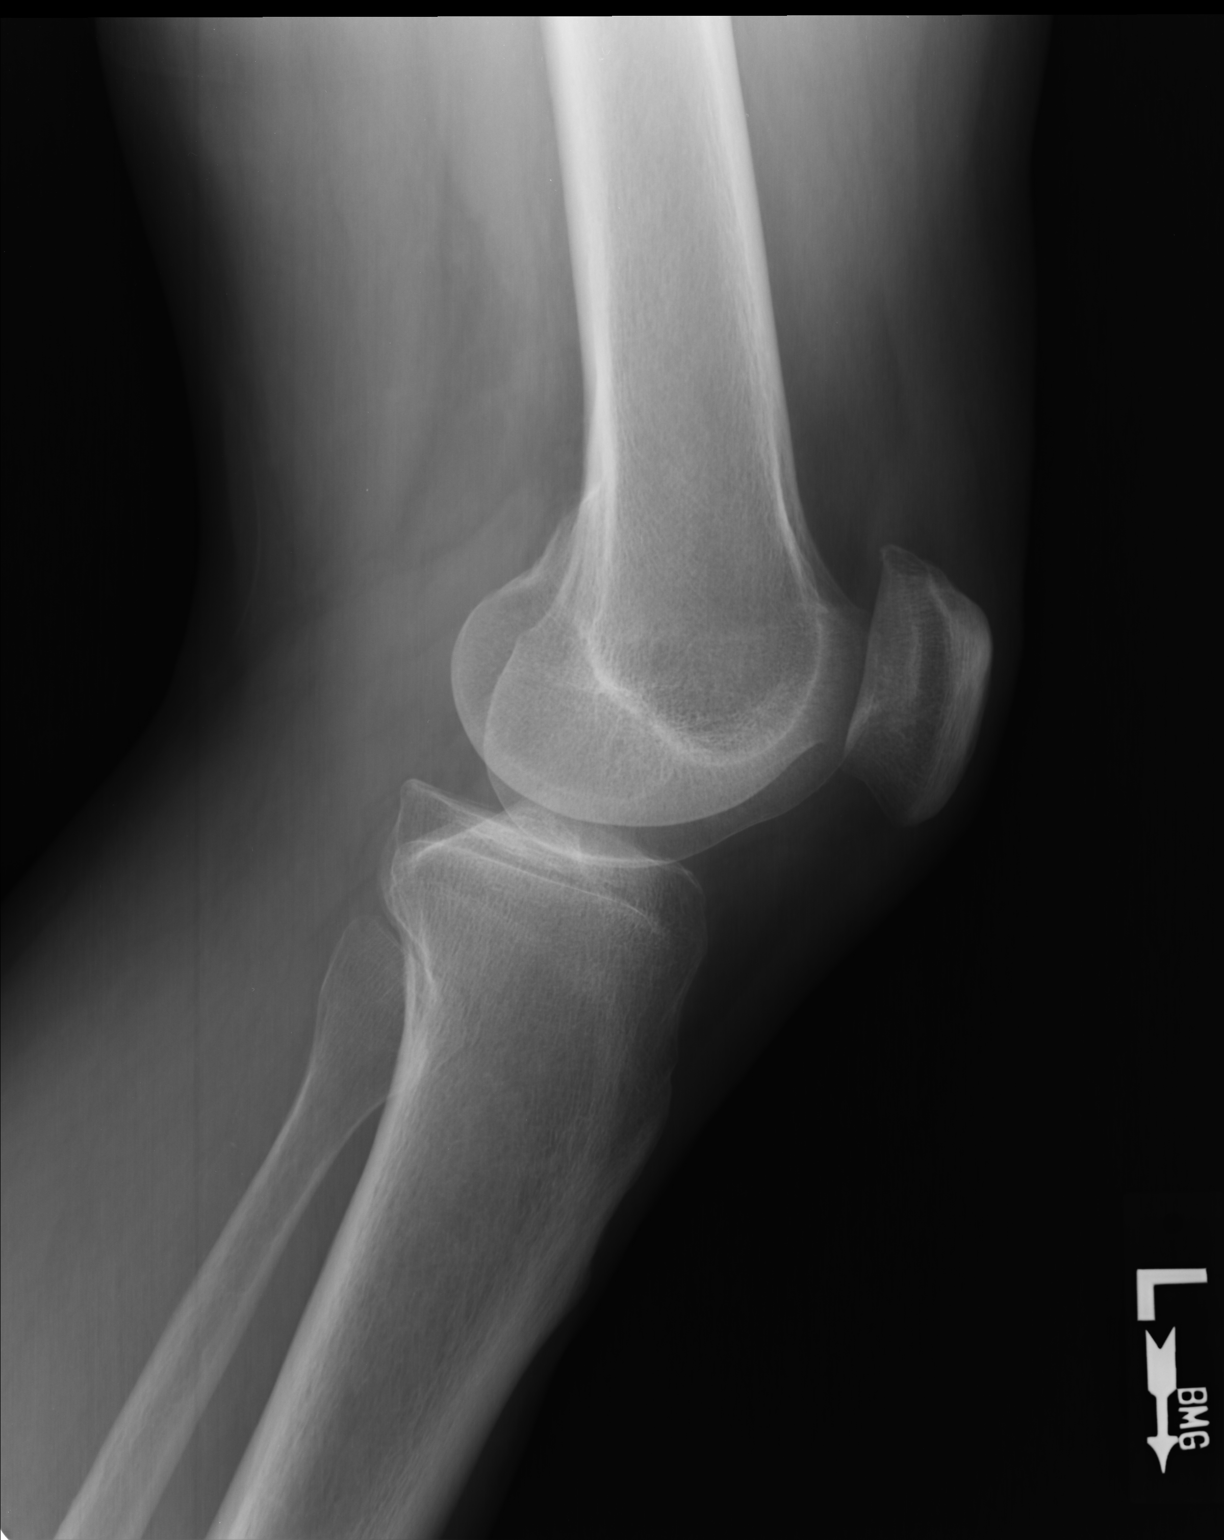

[3 of 3 positions shown; findings below may reference images not displayed]

FINDINGS: There is no evidence of fracture, dislocation, or joint effusion.
There is no evidence of arthropathy or other focal bone abnormality.
Soft tissues are unremarkable.
IMPRESSION: Normal left knee.

## 2017-01-24 IMAGING — CR DG ANKLE COMPLETE 3+V*L*
4 series · 4 of 4 positions shown · non-contrast
Comparison: 09/15/2014

CLINICAL DATA: Ankle pain, no known injury, initial encounter

EXAM:
LEFT ANKLE COMPLETE - 3+ VIEW

[AP]
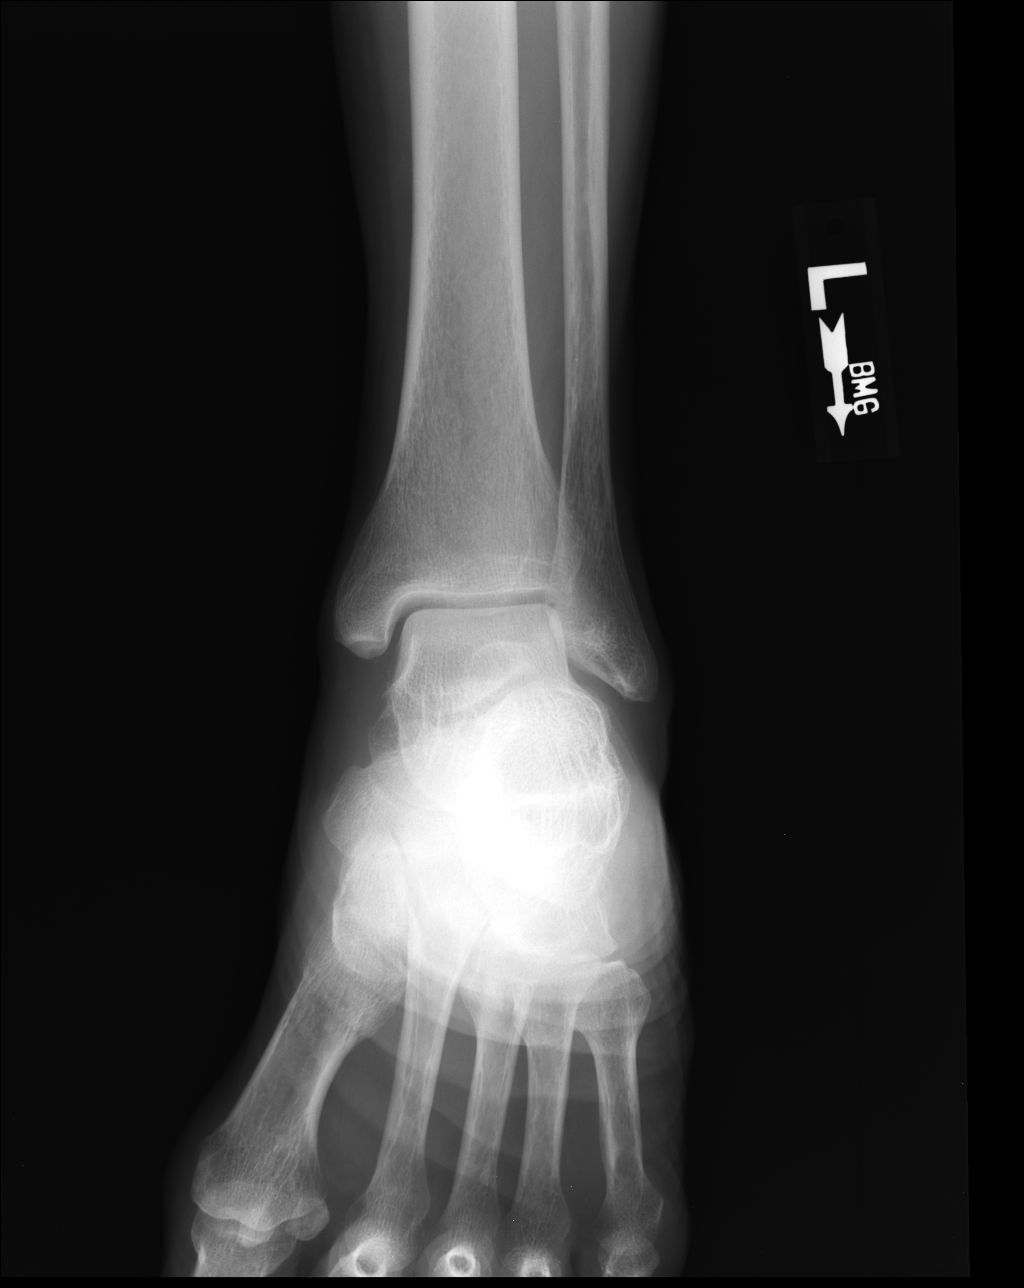

[ap obl int rot]
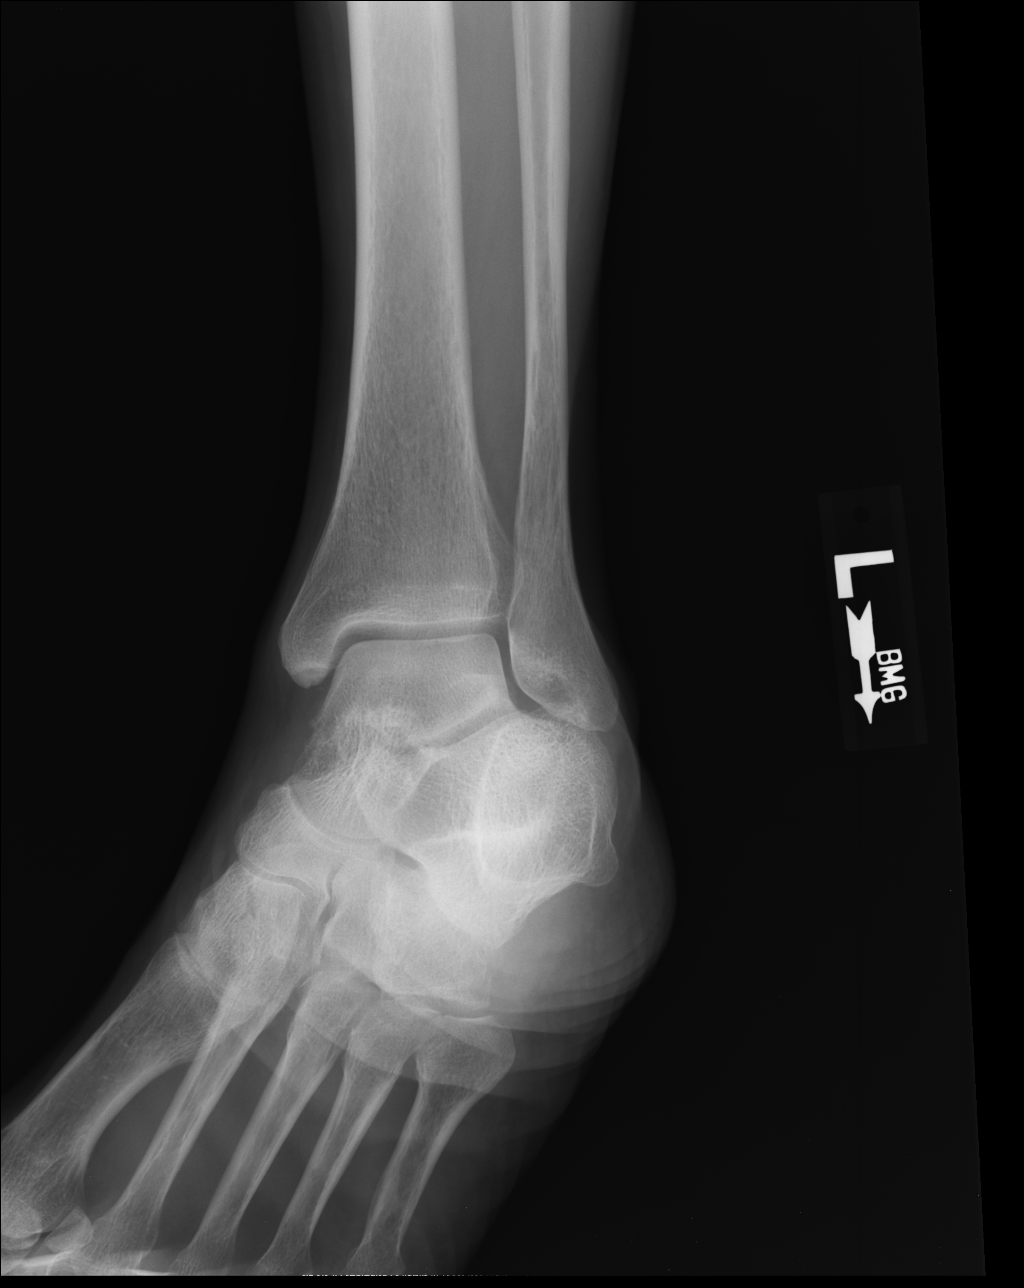

[medial obl]
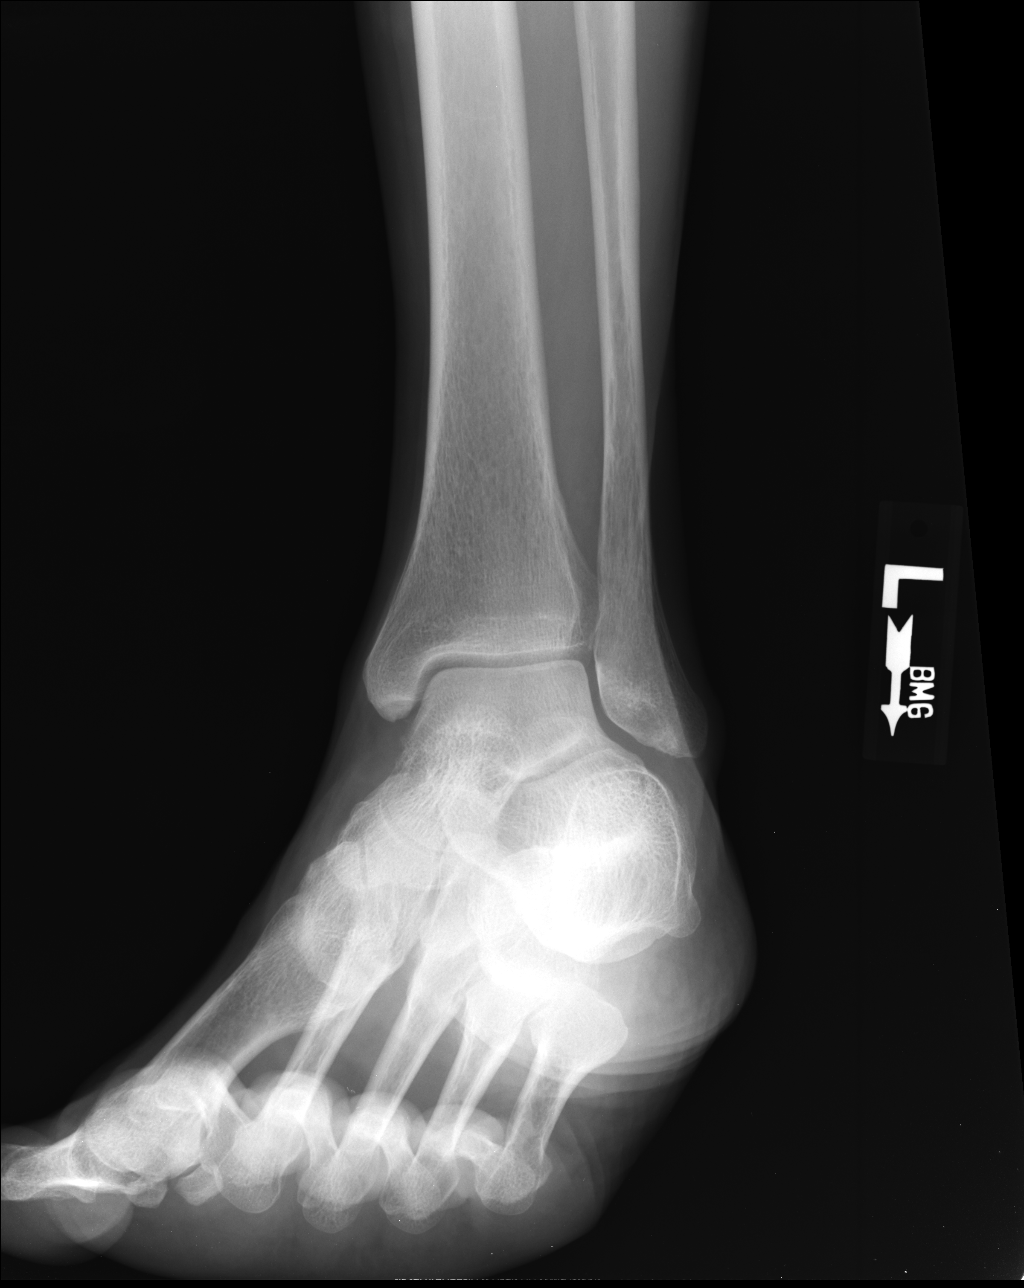

[lateral]
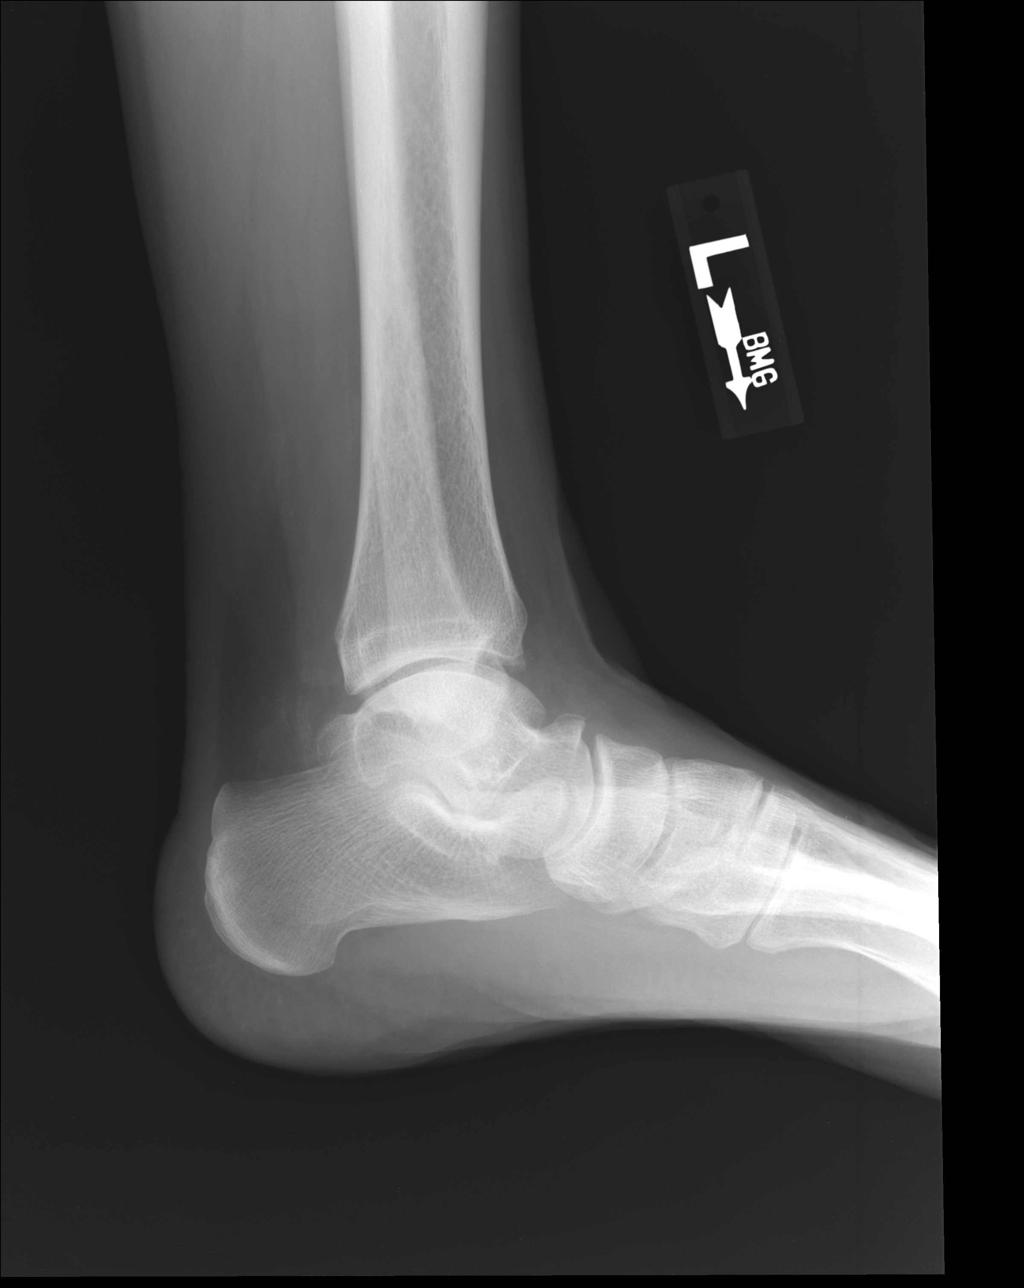

[4 of 4 positions shown; findings below may reference images not displayed]

FINDINGS: There is no evidence of fracture, dislocation, or joint effusion.
There is no evidence of arthropathy or other focal bone abnormality.
Soft tissues are unremarkable.
IMPRESSION: No acute abnormality noted.

## 2017-07-04 DIAGNOSIS — M47816 Spondylosis without myelopathy or radiculopathy, lumbar region: Secondary | ICD-10-CM | POA: Insufficient documentation

## 2017-08-12 ENCOUNTER — Other Ambulatory Visit: Payer: Self-pay

## 2017-08-12 ENCOUNTER — Ambulatory Visit: Payer: BLUE CROSS/BLUE SHIELD | Admitting: Physician Assistant

## 2017-08-12 ENCOUNTER — Encounter: Payer: Self-pay | Admitting: Physician Assistant

## 2017-08-12 VITALS — BP 129/76 | HR 67 | Temp 97.8°F | Resp 16 | Ht 69.5 in | Wt 168.8 lb

## 2017-08-12 DIAGNOSIS — Z13228 Encounter for screening for other metabolic disorders: Secondary | ICD-10-CM | POA: Diagnosis not present

## 2017-08-12 DIAGNOSIS — R109 Unspecified abdominal pain: Secondary | ICD-10-CM

## 2017-08-12 LAB — POCT URINALYSIS DIP (MANUAL ENTRY)
BILIRUBIN UA: NEGATIVE
GLUCOSE UA: NEGATIVE mg/dL
Ketones, POC UA: NEGATIVE mg/dL
LEUKOCYTES UA: NEGATIVE
Nitrite, UA: NEGATIVE
Protein Ur, POC: NEGATIVE mg/dL
Spec Grav, UA: <=1.005 — AB (ref 1.010–1.025)
Urobilinogen, UA: 0.2 E.U./dL
pH, UA: 5.5 (ref 5.0–8.0)

## 2017-08-12 LAB — POCT CBC
Granulocyte percent: 48 %G (ref 37–80)
HEMATOCRIT: 42 % — AB (ref 43.5–53.7)
Hemoglobin: 14.3 g/dL (ref 14.1–18.1)
LYMPH, POC: 2.1 (ref 0.6–3.4)
MCH, POC: 29.5 pg (ref 27–31.2)
MCHC: 34.2 g/dL (ref 31.8–35.4)
MCV: 86.4 fL (ref 80–97)
MID (cbc): 0.3 (ref 0–0.9)
MPV: 8.2 fL (ref 0–99.8)
PLATELET COUNT, POC: 170 10*3/uL (ref 142–424)
POC GRANULOCYTE: 2.3 (ref 2–6.9)
POC LYMPH %: 44.9 % (ref 10–50)
POC MID %: 7.1 %M (ref 0–12)
RBC: 4.86 M/uL (ref 4.69–6.13)
RDW, POC: 14.2 %
WBC: 4.7 10*3/uL (ref 4.6–10.2)

## 2017-08-12 LAB — POCT GLYCOSYLATED HEMOGLOBIN (HGB A1C): Hemoglobin A1C: 5.5

## 2017-08-12 NOTE — Patient Instructions (Addendum)
Your labs look great so far.  I will call if there are any problems.  If everything comes back normal I will send a letter in the mail.  Please could get your colonoscopy.    IF you received an x-ray today, you will receive an invoice from [facility]. Please contact [facility] at [phone] with questions or concerns regarding your invoice.   IF you received labwork today, you will receive an invoice from [place]. Please contact [facility] at [phone] with questions or concerns regarding your invoice.   Our billing staff will not be able to assist you with questions regarding bills from these companies.  You will be contacted with the lab results as soon as they are available. The fastest way to get your results is to activate your My Chart account. Instructions are located on the last page of this paperwork. If you have not heard from [place] regarding the results in 2 weeks, please contact this office.    Your labs so far look great.  I will call if there are any problems

## 2017-08-12 NOTE — Progress Notes (Signed)
08/12/2017 9:24 AM   DOB: 1955-07-23 / MRN: [id number]  SUBJECTIVE:  Alexander George is a 62 y.o. male presenting for bilateral flank pain that improves with drinking water.  Symptoms have been present for 3-4 months.  He is not getting worse or better.  He has a history of back pain with degenerative disc disease at multiple levels throughout the lumbar spine.  He denies dysuria, urgency, frequency, fever, abdominal pain.  He is a former patient of Dr. Josepha Pigg he has no history.  Of prostatitis and CHL.  He has a form that he would like me to sign today stating that he has a leg length discrepancy and flat feet.  His work will purchase shoes for him if I signed this form.  Immunization History  Administered Date(s) Administered  . Influenza,inj,Quad PF,6+ Mos 04/02/2013, 04/27/2015     He has No Known Allergies.   He  has no past medical history on file.    He  reports that  has never smoked. he has never used smokeless tobacco. He reports that he does not drink alcohol or use drugs. He  has no sexual activity history on file. The patient  has a past surgical history that includes Hernia repair and Knee surgery (Left).  His family history includes Cancer in his sister; Diabetes in his father.  Review of Systems  Constitutional: Negative for chills, diaphoresis and fever.  Eyes: Negative.   Respiratory: Negative for cough, hemoptysis, sputum production, shortness of breath and wheezing.   Cardiovascular: Negative for chest pain, orthopnea and leg swelling.  Gastrointestinal: Negative for abdominal pain, blood in stool, constipation, diarrhea, heartburn, melena, nausea and vomiting.  Genitourinary: Negative for dysuria, flank pain, frequency, hematuria and urgency.  Skin: Negative for rash.  Neurological: Negative for dizziness, sensory change, speech change, focal weakness and headaches.    The problem list and medications were reviewed and updated by myself where necessary and exist  elsewhere in the encounter.   OBJECTIVE:  BP 129/76 (BP Location: Right Arm, Patient Position: Sitting, Cuff Size: Normal)   Pulse 67   Temp 97.8 F (36.6 C) (Oral)   Resp 16   Ht 5' 9.5" (1.765 m)   Wt 168 lb 12.8 oz (76.6 kg)   SpO2 98%   BMI 24.57 kg/m   Physical Exam  Constitutional: He is oriented to person, place, and time. He appears well-developed. He is active and cooperative.  Non-toxic appearance.  Eyes: EOM are normal. Pupils are equal, round, and reactive to light.  Cardiovascular: Normal rate, regular rhythm, S1 normal, S2 normal, normal heart sounds, intact distal pulses and normal pulses. Exam reveals no gallop and no friction rub.  No murmur heard. Pulmonary/Chest: Effort normal. No stridor. No tachypnea. No respiratory distress. He has no wheezes. He has no rales.  Abdominal: He exhibits no distension. There is no rigidity, no guarding and no CVA tenderness.  Musculoskeletal: He exhibits no edema.  Neurological: He is alert and oriented to person, place, and time. He has normal strength and normal reflexes. He is not disoriented. He displays normal reflexes. No cranial nerve deficit or sensory deficit. He exhibits normal muscle tone. Coordination and gait normal.  Skin: Skin is warm and dry. No rash noted. He is not diaphoretic. No erythema. No pallor.  Psychiatric: His behavior is normal.  Vitals reviewed.   Results for orders placed or performed in visit on 08/12/17 (from the past 72 hour(s))  POCT CBC     Status:  Abnormal   Collection Time: 08/12/17  9:16 AM  Result Value Ref Range   WBC 4.7 4.6 - 10.2 K/uL   Lymph, poc 2.1 0.6 - 3.4   POC LYMPH PERCENT 44.9 10 - 50 %L   MID (cbc) 0.3 0 - 0.9   POC MID % 7.1 0 - 12 %M   POC Granulocyte 2.3 2 - 6.9   Granulocyte percent 48.0 37 - 80 %G   RBC 4.86 4.69 - 6.13 M/uL   Hemoglobin 14.3 14.1 - 18.1 g/dL   HCT, POC 42.0 (A) 43.5 - 53.7 %   MCV 86.4 80 - 97 fL   MCH, POC 29.5 27 - 31.2 pg   MCHC 34.2 31.8 -  35.4 g/dL   RDW, POC 14.2 %   Platelet Count, POC 170 142 - 424 K/uL   MPV 8.2 0 - 99.8 fL  POCT glycosylated hemoglobin (Hb A1C)     Status: Normal   Collection Time: 08/12/17  9:19 AM  Result Value Ref Range   Hemoglobin A1C 5.5     No results found.  ASSESSMENT AND PLAN:  Alexander George was seen today for flank pain.  Diagnoses and all orders for this visit:  Bilateral flank pain: His physical exam is completely normal.  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  This is most likely causing his low back pain and why it improves with water I do not know.  I advised that he take Tylenol when he has this pain, as needed.  I signed his form as he is requested. -     POCT CBC -     CMP and Liver -     POCT urinalysis dipstick -     PSA  Screening for metabolic disorder:  -     POCT glycosylated hemoglobin (Hb A1C)    The patient is advised to call or return to clinic if he does not see an improvement in symptoms, or to seek the care of the closest emergency department if he worsens with the above plan.   Alexander George, MHS, PA-C [facility] 08/12/2017 9:24 AM

## 2017-08-13 LAB — CMP AND LIVER
ALBUMIN: 4.4 g/dL (ref 3.6–4.8)
ALK PHOS: 45 IU/L (ref 39–117)
ALT: 12 IU/L (ref 0–44)
AST: 17 IU/L (ref 0–40)
BILIRUBIN, DIRECT: 0.22 mg/dL (ref 0.00–0.40)
BUN: 18 mg/dL (ref 8–27)
Bilirubin Total: 0.8 mg/dL (ref 0.0–1.2)
CO2: 23 mmol/L (ref 20–29)
Calcium: 9.1 mg/dL (ref 8.6–10.2)
Chloride: 101 mmol/L (ref 96–106)
Creatinine, Ser: 0.89 mg/dL (ref 0.76–1.27)
GFR calc Af Amer: 107 mL/min/{1.73_m2} (ref >59)
GFR calc non Af Amer: 92 mL/min/{1.73_m2} (ref >59)
Glucose: 120 mg/dL — ABNORMAL HIGH (ref 65–99)
POTASSIUM: 3.5 mmol/L (ref 3.5–5.2)
SODIUM: 140 mmol/L (ref 134–144)
TOTAL PROTEIN: 7.1 g/dL (ref 6.0–8.5)

## 2017-08-13 LAB — PSA: PROSTATE SPECIFIC AG, SERUM: 0.7 ng/mL (ref 0.0–4.0)

## 2017-08-13 NOTE — Progress Notes (Signed)
Please send a letter with labs from most recent visit. No explanation for patients bilateral flank pain in this workout. His pain is most likely musculoskeletal. Please advise he follow the plan as discussed in the office.

## 2017-09-16 ENCOUNTER — Encounter: Payer: Self-pay | Admitting: Physician Assistant

## 2017-09-16 ENCOUNTER — Ambulatory Visit: Payer: BLUE CROSS/BLUE SHIELD | Admitting: Physician Assistant

## 2017-09-16 ENCOUNTER — Other Ambulatory Visit: Payer: Self-pay

## 2017-09-16 VITALS — BP 116/62 | HR 75 | Temp 98.1°F | Resp 16 | Ht 69.0 in | Wt 169.0 lb

## 2017-09-16 DIAGNOSIS — L299 Pruritus, unspecified: Secondary | ICD-10-CM | POA: Diagnosis not present

## 2017-09-16 NOTE — Progress Notes (Signed)
    09/16/2017 1:53 PM   DOB: [birth year] / MRN: [id number]  SUBJECTIVE:  Alexander George is a 62 y.o. male presenting for itchiness that occurs when he wears a polyester shirt.  He has to wear this at [employer] and this is the standard uniforms.  He wants me to write him a letter saying that he has an allergy to polyester.  He is well-appearing today and has no rash   Review of Systems    The problem list and medications were reviewed and updated by myself where necessary and exist elsewhere in the encounter.   OBJECTIVE:  BP 116/62   Pulse 75   Temp 98.1 F (36.7 C) (Oral)   Resp 16   Ht 5\' 9"  (1.753 m)   Wt 169 lb (76.7 kg)   SpO2 98%   BMI 24.96 kg/m   Physical Exam  Constitutional: He appears well-developed. He is active and cooperative.  Non-toxic appearance.  Cardiovascular: Normal rate.  Pulmonary/Chest: Effort normal. No tachypnea.  Neurological: He is alert.  Skin: Skin is warm and dry. He is not diaphoretic. No pallor.  Vitals reviewed.   No results found for this or any previous visit (from the past 72 hour(s)).  No results found.  ASSESSMENT AND PLAN:  Alexander George was seen today for rash.  Diagnoses and all orders for this visit:  Pruritic condition: Patient requesting a letter documenting he has an allergy to polyester.  While this may cause him some skin irritation he certainly does not have an "allergy" to polyester his he has  the shirt with him and has no difficulty with handling it.  I written a letter saying that he has itchiness with wearing polyester and to please allow him to wear a cotton  shirt.    The patient is advised to call or return to clinic if he does not see an improvement in symptoms, or to seek the care of the closest emergency department if he worsens with the above plan.   Philis Fendt, MHS, PA-C [facility] 09/16/2017 1:53 PM

## 2017-09-16 NOTE — Patient Instructions (Signed)
     IF you received an x-ray today, you will receive an invoice from [facility]. Please contact [facility] at [phone] with questions or concerns regarding your invoice.   IF you received labwork today, you will receive an invoice from [facility]. Please contact [facility] at [phone] with questions or concerns regarding your invoice.   Our billing staff will not be able to assist you with questions regarding bills from these companies.  You will be contacted with the lab results as soon as they are available. The fastest way to get your results is to activate your My Chart account. Instructions are located on the last page of this paperwork. If you have not heard from us regarding the results in 2 weeks, please contact this office.     

## 2017-10-16 ENCOUNTER — Other Ambulatory Visit: Payer: Self-pay | Admitting: Physician Assistant

## 2017-10-25 ENCOUNTER — Encounter: Payer: Self-pay | Admitting: Physician Assistant

## 2017-11-11 ENCOUNTER — Ambulatory Visit: Payer: BLUE CROSS/BLUE SHIELD | Admitting: Physician Assistant

## 2017-12-04 ENCOUNTER — Encounter: Payer: Self-pay | Admitting: Gastroenterology

## 2018-01-06 ENCOUNTER — Ambulatory Visit (AMBULATORY_SURGERY_CENTER): Payer: Self-pay | Admitting: *Deleted

## 2018-01-06 VITALS — Ht 69.0 in | Wt 166.0 lb

## 2018-01-06 DIAGNOSIS — Z1211 Encounter for screening for malignant neoplasm of colon: Secondary | ICD-10-CM

## 2018-01-06 MED ORDER — PEG-KCL-NACL-NASULF-NA ASC-C 140 G PO SOLR: 1.0000 | Freq: Once | ORAL | 0 refills | Status: AC

## 2018-01-06 NOTE — Progress Notes (Signed)
Patient denies any allergies to eggs or soy. Patient denies any problems with anesthesia/sedation. Patient denies any oxygen use at home. Patient denies taking any diet/weight loss medications or blood thinners. No email.  plenvu universal copay card given to pt and explained.

## 2018-01-20 ENCOUNTER — Ambulatory Visit (AMBULATORY_SURGERY_CENTER): Payer: BLUE CROSS/BLUE SHIELD | Admitting: Gastroenterology

## 2018-01-20 ENCOUNTER — Encounter: Payer: Self-pay | Admitting: Gastroenterology

## 2018-01-20 VITALS — BP 90/59 | HR 58 | Temp 97.5°F | Resp 15 | Ht 69.0 in | Wt 166.0 lb

## 2018-01-20 DIAGNOSIS — D123 Benign neoplasm of transverse colon: Secondary | ICD-10-CM

## 2018-01-20 DIAGNOSIS — K635 Polyp of colon: Secondary | ICD-10-CM

## 2018-01-20 DIAGNOSIS — Z1211 Encounter for screening for malignant neoplasm of colon: Secondary | ICD-10-CM

## 2018-01-20 MED ORDER — SODIUM CHLORIDE 0.9 % IV SOLN: 500.0000 mL | Freq: Once | INTRAVENOUS | Status: DC

## 2018-01-20 NOTE — Progress Notes (Signed)
Pt's states no medical or surgical changes since previsit or office visit. 

## 2018-01-20 NOTE — Op Note (Signed)
[facility] Patient Name: Alexander George Procedure Date: 01/20/2018 3:27 PM MRN: [id number] Endoscopist: Mallie Mussel L. Loletha Carrow , MD Age: 62 Referring MD:  Date of Birth: 01/21/1956 Gender: Male Account #: [id number] Procedure:                Colonoscopy Indications:              Screening for colorectal malignant neoplasm (no                            polyps last colonoscopy 2007) Medicines:                Monitored Anesthesia Care Procedure:                Pre-Anesthesia Assessment:                           - Prior to the procedure, a History and Physical                            was performed, and patient medications and                            allergies were reviewed. The patient's tolerance of                            previous anesthesia was also reviewed. The risks                            and benefits of the procedure and the sedation                            options and risks were discussed with the patient.                            All questions were answered, and informed consent                            was obtained. Prior Anticoagulants: The patient has                            taken no previous anticoagulant or antiplatelet                            agents. ASA Grade Assessment: II - A patient with                            mild systemic disease. After reviewing the risks                            and benefits, the patient was deemed in                            satisfactory condition to undergo the procedure.  After obtaining informed consent, the colonoscope                            was passed under direct vision. Throughout the                            procedure, the patient's blood pressure, pulse, and                            oxygen saturations were monitored continuously. The                            Colonoscope was introduced through the anus and                            advanced to the the cecum,  identified by                            appendiceal orifice and ileocecal valve. The                            colonoscopy was performed without difficulty. The                            patient tolerated the procedure well. The quality                            of the bowel preparation was excellent. The                            ileocecal valve, appendiceal orifice, and rectum                            were photographed. Scope In: 3:37:44 PM Scope Out: 3:54:10 PM Scope Withdrawal Time: 0 hours 12 minutes 54 seconds  Total Procedure Duration: 0 hours 16 minutes 26 seconds  Findings:                 The perianal and digital rectal examinations were                            normal except for a small, well-demarcated mole                            with uniform pigmentation (which appears to account                            for the patient's report of a palpable skin                            abnormality).                           A 8 mm polyp was found in the hepatic flexure. The  polyp was pedunculated. The polyp was removed en                            bloc with a hot snare. Resection and retrieval were                            complete after snaring polyp into smaller pieces.                           The exam was otherwise without abnormality on                            direct and retroflexion views. Complications:            No immediate complications. Estimated Blood Loss:     Estimated blood loss: none. Impression:               - One 8 mm polyp at the hepatic flexure, removed                            with a hot snare. Resected and retrieved.                           - The examination was otherwise normal on direct                            and retroflexion views. Recommendation:           - Patient has a contact number available for                            emergencies. The signs and symptoms of potential                             delayed complications were discussed with the                            patient. Return to normal activities tomorrow.                            Written discharge instructions were provided to the                            patient.                           - Resume previous diet.                           - Continue present medications.                           - Await pathology results.                           - Repeat colonoscopy is recommended for  surveillance. The colonoscopy date will be                            determined after pathology results from today's                            exam become available for review.                           - Primary care can consider referral to general                            surgery if peri-anal skin lesion is bothersome to                            patient. Alyshia Kernan L. Loletha Carrow, MD 01/20/2018 4:04:34 PM This report has been signed electronically.

## 2018-01-20 NOTE — Progress Notes (Signed)
Called to room to assist during endoscopic procedure.  Patient ID and intended procedure confirmed with present staff. Received instructions for my participation in the procedure from the performing physician.  

## 2018-01-20 NOTE — Patient Instructions (Signed)
Handout given on polyps  YOU HAD AN ENDOSCOPIC PROCEDURE TODAY AT THE [facility]:   Refer to the procedure report that was given to you for any specific questions about what was found during the examination.  If the procedure report does not answer your questions, please call your gastroenterologist to clarify.  If you requested that your care partner not be given the details of your procedure findings, then the procedure report has been included in a sealed envelope for you to review at your convenience later.  YOU SHOULD EXPECT: Some feelings of bloating in the abdomen. Passage of more gas than usual.  Walking can help get rid of the air that was put into your GI tract during the procedure and reduce the bloating. If you had a lower endoscopy (such as a colonoscopy or flexible sigmoidoscopy) you may notice spotting of blood in your stool or on the toilet paper. If you underwent a bowel prep for your procedure, you may not have a normal bowel movement for a few days.  Please Note:  You might notice some irritation and congestion in your nose or some drainage.  This is from the oxygen used during your procedure.  There is no need for concern and it should clear up in a day or so.  SYMPTOMS TO REPORT IMMEDIATELY:   Following lower endoscopy (colonoscopy or flexible sigmoidoscopy):  Excessive amounts of blood in the stool  Significant tenderness or worsening of abdominal pains  Swelling of the abdomen that is new, acute  Fever of 100F or higher    For urgent or emergent issues, a gastroenterologist can be reached at any hour by calling [phone].   DIET:  We do recommend a small meal at first, but then you may proceed to your regular diet.  Drink plenty of fluids but you should avoid alcoholic beverages for 24 hours.  ACTIVITY:  You should plan to take it easy for the rest of today and you should NOT DRIVE or use heavy machinery until tomorrow (because of the sedation  medicines used during the test).    FOLLOW UP: Our staff will call the number listed on your records the next business day following your procedure to check on you and address any questions or concerns that you may have regarding the information given to you following your procedure. If we do not reach you, we will leave a message.  However, if you are feeling well and you are not experiencing any problems, there is no need to return our call.  We will assume that you have returned to your regular daily activities without incident.  If any biopsies were taken you will be contacted by phone or by letter within the next 1-3 weeks.  Please call us at [phone] if you have not heard about the biopsies in 3 weeks.    SIGNATURES/CONFIDENTIALITY: You and/or your care partner have signed paperwork which will be entered into your electronic medical record.  These signatures attest to the fact that that the information above on your After Visit Summary has been reviewed and is understood.  Full responsibility of the confidentiality of this discharge information lies with you and/or your care-partner. 

## 2018-01-20 NOTE — Progress Notes (Signed)
To PACU, VSS. Report to [email or web address] 

## 2018-01-21 ENCOUNTER — Telehealth: Payer: Self-pay | Admitting: *Deleted

## 2018-01-21 NOTE — Telephone Encounter (Signed)
  Follow up Call-  Call back number 01/20/2018  Post procedure Call Back phone  # [phone]  Permission to leave phone message Yes  Some recent data might be hidden    [facility]

## 2018-01-21 NOTE — Telephone Encounter (Signed)
Patient called back and was having no problems post procedure. No pain and is returned back to normal eating habits and activities. Also added Philis Fendt at [facility] as his PCP and sent the report to his as well. SM

## 2018-01-22 ENCOUNTER — Other Ambulatory Visit: Payer: Self-pay

## 2018-01-22 ENCOUNTER — Ambulatory Visit (INDEPENDENT_AMBULATORY_CARE_PROVIDER_SITE_OTHER): Payer: BLUE CROSS/BLUE SHIELD | Admitting: Emergency Medicine

## 2018-01-22 ENCOUNTER — Encounter: Payer: Self-pay | Admitting: Emergency Medicine

## 2018-01-22 VITALS — BP 118/73 | HR 61 | Temp 98.3°F | Resp 16 | Ht 68.9 in | Wt 162.0 lb

## 2018-01-22 DIAGNOSIS — Z23 Encounter for immunization: Secondary | ICD-10-CM | POA: Diagnosis not present

## 2018-01-22 DIAGNOSIS — Z0000 Encounter for general adult medical examination without abnormal findings: Secondary | ICD-10-CM

## 2018-01-22 NOTE — Progress Notes (Signed)
Alexander George 62 y.o.   Chief Complaint  Patient presents with  . Annual Exam    HISTORY OF PRESENT ILLNESS: This is a 62 y.o. male Here for annual exam; no complaints and no medical concerns. Healthy man with no chronic medical problems.  On no medications.  Non-smoker and non-EtOH user. Had a colonoscopy done 2 days ago.  One polyp found.  Follow-up in 10 years.  Was told everything was normal. Physical work.  Good nutrition. No urinary symptoms or GI symptoms.  HPI   Prior to Admission medications   Medication Sig Start Date End Date Taking? Authorizing Provider  Ciclopirox 0.77 % gel  01/09/18  Yes [provider]    No Known Allergies  Patient Active Problem List   Diagnosis Date Noted  . Degeneration of lumbar or lumbosacral intervertebral disc 07/30/2013  . INGUINAL HERNIORRHAPHY, LEFT, HX OF 03/16/2008    Past Medical History:  Diagnosis Date  . Heart murmur   . Seizures ([place]) 40 years ago    none since then per pt  . Sleep apnea    CPAP     Past Surgical History:  Procedure Laterality Date  . COLONOSCOPY  07/20/2005   Dr.Kaplan=normal exam   . HERNIA REPAIR    . KNEE SURGERY Left    6-7 years ago    Social History   Socioeconomic History  . Marital status: Married    Spouse name: Not on file  . Number of children: Not on file  . Years of education: Not on file  . Highest education level: Not on file  Occupational History  . Not on file  Social Needs  . Financial resource strain: Not on file  . Food insecurity:    Worry: Not on file    Inability: Not on file  . Transportation needs:    Medical: Not on file    Non-medical: Not on file  Tobacco Use  . Smoking status: Never Smoker  . Smokeless tobacco: Never Used  Substance and Sexual Activity  . Alcohol use: No  . Drug use: No  . Sexual activity: Not on file  Lifestyle  . Physical activity:    Days per week: Not on file    Minutes per session: Not on file  . Stress: Not on  file  Relationships  . Social connections:    Talks on phone: Not on file    Gets together: Not on file    Attends religious service: Not on file    Active member of club or organization: Not on file    Attends meetings of clubs or organizations: Not on file    Relationship status: Not on file  . Intimate partner violence:    Fear of current or ex partner: Not on file    Emotionally abused: Not on file    Physically abused: Not on file    Forced sexual activity: Not on file  Other Topics Concern  . Not on file  Social History Narrative  . Not on file    Family History  Problem Relation Age of Onset  . Diabetes Father   . Cancer Sister        stomach  . Stomach cancer Sister   . Colon cancer Neg Hx   . Esophageal cancer Neg Hx   . Rectal cancer Neg Hx      Review of Systems  Constitutional: Negative.  Negative for chills, fever, malaise/fatigue and weight loss.  HENT: Negative.  Negative  for congestion, hearing loss, nosebleeds and sore throat.   Eyes: Negative.  Negative for blurred vision and double vision.  Respiratory: Negative.  Negative for cough and shortness of breath.   Cardiovascular: Negative.  Negative for chest pain, palpitations and leg swelling.  Gastrointestinal: Negative.  Negative for abdominal pain, blood in stool, diarrhea, melena, nausea and vomiting.  Genitourinary: Negative.  Negative for dysuria, flank pain and hematuria.  Musculoskeletal: Negative.  Negative for back pain, myalgias and neck pain.  Skin: Negative.  Negative for rash.  Neurological: Negative.  Negative for dizziness, sensory change, focal weakness and headaches.  Endo/Heme/Allergies: Negative.     Vitals:   01/22/18 0803  BP: 118/73  Pulse: 61  Resp: 16  Temp: 98.3 F (36.8 C)  SpO2: 96%    Physical Exam  Constitutional: He is oriented to person, place, and time. He appears well-developed and well-nourished.  HENT:  Head: Normocephalic and atraumatic.  Right Ear:  External ear normal.  Left Ear: External ear normal.  Nose: Nose normal.  Mouth/Throat: Oropharynx is clear and moist.  Eyes: Pupils are equal, round, and reactive to light. Conjunctivae and EOM are normal.  Neck: Normal range of motion. Neck supple. No JVD present. No thyromegaly present.  Cardiovascular: Normal rate, regular rhythm and normal heart sounds.  Pulmonary/Chest: Effort normal and breath sounds normal.  Abdominal: Soft. Bowel sounds are normal. He exhibits no distension and no mass. There is no tenderness. There is no guarding. No hernia.  Musculoskeletal: Normal range of motion. He exhibits no edema or tenderness.  Lymphadenopathy:    He has no cervical adenopathy.  Neurological: He is alert and oriented to person, place, and time. He displays normal reflexes. No sensory deficit. He exhibits normal muscle tone. Coordination normal.  Skin: Skin is warm and dry. Capillary refill takes less than 2 seconds. No rash noted.  Psychiatric: He has a normal mood and affect. His behavior is normal.  Vitals reviewed.    ASSESSMENT & PLAN: Alexander George was seen today for annual exam.  Diagnoses and all orders for this visit:  Routine general medical examination at a health care facility -     Tdap vaccine greater than or equal to 7yo IM -     CBC with Differential -     Comprehensive metabolic panel -     Hemoglobin A1c -     Lipid panel -     PSA(Must document that pt has been informed of limitations of PSA testing.) -     TSH    Patient Instructions       I will contact you with your lab results within the next 2 weeks.  If you have not heard from [place] then please contact us. The fastest way to get your results is to register for My Chart.   IF you received an x-ray today, you will receive an invoice from [facility]. Please contact [facility] at [phone] with questions or concerns regarding your invoice.   IF you received labwork today, you will  receive an invoice from [facility]. Please contact [facility] at [phone] with questions or concerns regarding your invoice.   Our billing staff will not be able to assist you with questions regarding bills from these companies.  You will be contacted with the lab results as soon as they are available. The fastest way to get your results is to activate your My Chart account. Instructions are located on the last page of this paperwork. If you have  not heard from [place] regarding the results in 2 weeks, please contact this office.       Health Maintenance, Male A healthy lifestyle and preventive care is important for your health and wellness. Ask your health care provider about what schedule of regular examinations is right for you. What should I know about weight and diet? Eat a Healthy Diet  Eat plenty of vegetables, fruits, whole grains, low-fat dairy products, and lean protein.  Do not eat a lot of foods high in solid fats, added sugars, or salt.  Maintain a Healthy Weight Regular exercise can help you achieve or maintain a healthy weight. You should:  Do at least 150 minutes of exercise each week. The exercise should increase your heart rate and make you sweat (moderate-intensity exercise).  Do strength-training exercises at least twice a week.  Watch Your Levels of Cholesterol and Blood Lipids  Have your blood tested for lipids and cholesterol every 5 years starting at 61 years of age. If you are at high risk for heart disease, you should start having your blood tested when you are 61 years old. You may need to have your cholesterol levels checked more often if: ? Your lipid or cholesterol levels are high. ? You are older than 61 years of age. ? You are at high risk for heart disease.  What should I know about cancer screening? Many types of cancers can be detected early and may often be prevented. Lung Cancer  You should be screened every year for lung cancer if: ? You are a  current smoker who has smoked for at least 30 years. ? You are a former smoker who has quit within the past 15 years.  Talk to your health care provider about your screening options, when you should start screening, and how often you should be screened.  Colorectal Cancer  Routine colorectal cancer screening usually begins at 61 years of age and should be repeated every 5-10 years until you are 61 years old. You may need to be screened more often if early forms of precancerous polyps or small growths are found. Your health care provider may recommend screening at an earlier age if you have risk factors for colon cancer.  Your health care provider may recommend using home test kits to check for hidden blood in the stool.  A small camera at the end of a tube can be used to examine your colon (sigmoidoscopy or colonoscopy). This checks for the earliest forms of colorectal cancer.  Prostate and Testicular Cancer  Depending on your age and overall health, your health care provider may do certain tests to screen for prostate and testicular cancer.  Talk to your health care provider about any symptoms or concerns you have about testicular or prostate cancer.  Skin Cancer  Check your skin from head to toe regularly.  Tell your health care provider about any new moles or changes in moles, especially if: ? There is a change in a mole's size, shape, or color. ? You have a mole that is larger than a pencil eraser.  Always use sunscreen. Apply sunscreen liberally and repeat throughout the day.  Protect yourself by wearing long sleeves, pants, a wide-brimmed hat, and sunglasses when outside.  What should I know about heart disease, diabetes, and high blood pressure?  If you are 22-57 years of age, have your blood pressure checked every 3-5 years. If you are 73 years of age or older, have your blood pressure checked every  year. You should have your blood pressure measured twice-once when you are at  a hospital or clinic, and once when you are not at a hospital or clinic. Record the average of the two measurements. To check your blood pressure when you are not at a hospital or clinic, you can use: ? An automated blood pressure machine at a pharmacy. ? A home blood pressure monitor.  Talk to your health care provider about your target blood pressure.  If you are between 63-18 years old, ask your health care provider if you should take aspirin to prevent heart disease.  Have regular diabetes screenings by checking your fasting blood sugar level. ? If you are at a normal weight and have a low risk for diabetes, have this test once every three years after the age of 50. ? If you are overweight and have a high risk for diabetes, consider being tested at a younger age or more often.  A one-time screening for abdominal aortic aneurysm (AAA) by ultrasound is recommended for men aged 32-75 years who are current or former smokers. What should I know about preventing infection? Hepatitis B If you have a higher risk for hepatitis B, you should be screened for this virus. Talk with your health care provider to find out if you are at risk for hepatitis B infection. Hepatitis C Blood testing is recommended for:  Everyone born from [birth year] through 1964.  Anyone with known risk factors for hepatitis C.  Sexually Transmitted Diseases (STDs)  You should be screened each year for STDs including gonorrhea and chlamydia if: ? You are sexually active and are younger than 61 years of age. ? You are older than 61 years of age and your health care provider tells you that you are at risk for this type of infection. ? Your sexual activity has changed since you were last screened and you are at an increased risk for chlamydia or gonorrhea. Ask your health care provider if you are at risk.  Talk with your health care provider about whether you are at high risk of being infected with HIV. Your health care provider  may recommend a prescription medicine to help prevent HIV infection.  What else can I do?  Schedule regular health, dental, and eye exams.  Stay current with your vaccines (immunizations).  Do not use any tobacco products, such as cigarettes, chewing tobacco, and e-cigarettes. If you need help quitting, ask your health care provider.  Limit alcohol intake to no more than 2 drinks per day. One drink equals 12 ounces of beer, 5 ounces of wine, or 1 ounces of hard liquor.  Do not use street drugs.  Do not share needles.  Ask your health care provider for help if you need support or information about quitting drugs.  Tell your health care provider if you often feel depressed.  Tell your health care provider if you have ever been abused or do not feel safe at home. This information is not intended to replace advice given to you by your health care provider. Make sure you discuss any questions you have with your health care provider. Document Released: 11/24/2007 Document Revised: 01/25/2016 Document Reviewed: 03/01/2015 Elsevier Interactive Patient Education  2018 [employer]vier Inc.      Agustina Caroli, MD [facility]

## 2018-01-22 NOTE — Patient Instructions (Addendum)
I will contact you with your lab results within the next 2 weeks.  If you have not heard from [place] then please contact us. The fastest way to get your results is to register for My Chart.   IF you received an x-ray today, you will receive an invoice from [facility]. Please contact [facility] at [phone] with questions or concerns regarding your invoice.   IF you received labwork today, you will receive an invoice from [place]. Please contact [facility] at [phone] with questions or concerns regarding your invoice.   Our billing staff will not be able to assist you with questions regarding bills from these companies.  You will be contacted with the lab results as soon as they are available. The fastest way to get your results is to activate your My Chart account. Instructions are located on the last page of this paperwork. If you have not heard from [place] regarding the results in 2 weeks, please contact this office.       Health Maintenance, Male A healthy lifestyle and preventive care is important for your health and wellness. Ask your health care provider about what schedule of regular examinations is right for you. What should I know about weight and diet? Eat a Healthy Diet  Eat plenty of vegetables, fruits, whole grains, low-fat dairy products, and lean protein.  Do not eat a lot of foods high in solid fats, added sugars, or salt.  Maintain a Healthy Weight Regular exercise can help you achieve or maintain a healthy weight. You should:  Do at least 150 minutes of exercise each week. The exercise should increase your heart rate and make you sweat (moderate-intensity exercise).  Do strength-training exercises at least twice a week.  Watch Your Levels of Cholesterol and Blood Lipids  Have your blood tested for lipids and cholesterol every 5 years starting at 61 years of age. If you are at high risk for heart disease, you should start having your blood  tested when you are 61 years old. You may need to have your cholesterol levels checked more often if: ? Your lipid or cholesterol levels are high. ? You are older than 61 years of age. ? You are at high risk for heart disease.  What should I know about cancer screening? Many types of cancers can be detected early and may often be prevented. Lung Cancer  You should be screened every year for lung cancer if: ? You are a current smoker who has smoked for at least 30 years. ? You are a former smoker who has quit within the past 15 years.  Talk to your health care provider about your screening options, when you should start screening, and how often you should be screened.  Colorectal Cancer  Routine colorectal cancer screening usually begins at 61 years of age and should be repeated every 5-10 years until you are 62 years old. You may need to be screened more often if early forms of precancerous polyps or small growths are found. Your health care provider may recommend screening at an earlier age if you have risk factors for colon cancer.  Your health care provider may recommend using home test kits to check for hidden blood in the stool.  A small camera at the end of a tube can be used to examine your colon (sigmoidoscopy or colonoscopy). This checks for the earliest forms of colorectal cancer.  Prostate and Testicular Cancer  Depending on your age and overall health,  your health care provider may do certain tests to screen for prostate and testicular cancer.  Talk to your health care provider about any symptoms or concerns you have about testicular or prostate cancer.  Skin Cancer  Check your skin from head to toe regularly.  Tell your health care provider about any new moles or changes in moles, especially if: ? There is a change in a mole's size, shape, or color. ? You have a mole that is larger than a pencil eraser.  Always use sunscreen. Apply sunscreen liberally and repeat  throughout the day.  Protect yourself by wearing long sleeves, pants, a wide-brimmed hat, and sunglasses when outside.  What should I know about heart disease, diabetes, and high blood pressure?  If you are 36-11 years of age, have your blood pressure checked every 3-5 years. If you are 64 years of age or older, have your blood pressure checked every year. You should have your blood pressure measured twice-once when you are at a hospital or clinic, and once when you are not at a hospital or clinic. Record the average of the two measurements. To check your blood pressure when you are not at a hospital or clinic, you can use: ? An automated blood pressure machine at a pharmacy. ? A home blood pressure monitor.  Talk to your health care provider about your target blood pressure.  If you are between 32-94 years old, ask your health care provider if you should take aspirin to prevent heart disease.  Have regular diabetes screenings by checking your fasting blood sugar level. ? If you are at a normal weight and have a low risk for diabetes, have this test once every three years after the age of 42. ? If you are overweight and have a high risk for diabetes, consider being tested at a younger age or more often.  A one-time screening for abdominal aortic aneurysm (AAA) by ultrasound is recommended for men aged 16-75 years who are current or former smokers. What should I know about preventing infection? Hepatitis B If you have a higher risk for hepatitis B, you should be screened for this virus. Talk with your health care provider to find out if you are at risk for hepatitis B infection. Hepatitis C Blood testing is recommended for:  Everyone born from [birth year] through 1964.  Anyone with known risk factors for hepatitis C.  Sexually Transmitted Diseases (STDs)  You should be screened each year for STDs including gonorrhea and chlamydia if: ? You are sexually active and are younger than 61 years of  age. ? You are older than 61 years of age and your health care provider tells you that you are at risk for this type of infection. ? Your sexual activity has changed since you were last screened and you are at an increased risk for chlamydia or gonorrhea. Ask your health care provider if you are at risk.  Talk with your health care provider about whether you are at high risk of being infected with HIV. Your health care provider may recommend a prescription medicine to help prevent HIV infection.  What else can I do?  Schedule regular health, dental, and eye exams.  Stay current with your vaccines (immunizations).  Do not use any tobacco products, such as cigarettes, chewing tobacco, and e-cigarettes. If you need help quitting, ask your health care provider.  Limit alcohol intake to no more than 2 drinks per day. One drink equals 12 ounces of beer, 5 ounces  of wine, or 1 ounces of hard liquor.  Do not use street drugs.  Do not share needles.  Ask your health care provider for help if you need support or information about quitting drugs.  Tell your health care provider if you often feel depressed.  Tell your health care provider if you have ever been abused or do not feel safe at home. This information is not intended to replace advice given to you by your health care provider. Make sure you discuss any questions you have with your health care provider. Document Released: 11/24/2007 Document Revised: 01/25/2016 Document Reviewed: 03/01/2015 Elsevier Interactive Patient Education  [employer].

## 2018-01-23 ENCOUNTER — Encounter: Payer: Self-pay | Admitting: Radiology

## 2018-01-23 LAB — CBC WITH DIFFERENTIAL/PLATELET
BASOS: 1 %
Basophils Absolute: 0 10*3/uL (ref 0.0–0.2)
EOS (ABSOLUTE): 0.2 10*3/uL (ref 0.0–0.4)
EOS: 6 %
HEMATOCRIT: 41.8 % (ref 37.5–51.0)
Hemoglobin: 14.2 g/dL (ref 13.0–17.7)
IMMATURE GRANULOCYTES: 0 %
Immature Grans (Abs): 0 10*3/uL (ref 0.0–0.1)
Lymphocytes Absolute: 1.9 10*3/uL (ref 0.7–3.1)
Lymphs: 51 %
MCH: 29.6 pg (ref 26.6–33.0)
MCHC: 34 g/dL (ref 31.5–35.7)
MCV: 87 fL (ref 79–97)
MONOS ABS: 0.1 10*3/uL (ref 0.1–0.9)
Monocytes: 3 %
NEUTROS PCT: 39 %
Neutrophils Absolute: 1.5 10*3/uL (ref 1.4–7.0)
PLATELETS: 187 10*3/uL (ref 150–450)
RBC: 4.79 x10E6/uL (ref 4.14–5.80)
RDW: 14.3 % (ref 12.3–15.4)
WBC: 3.8 10*3/uL (ref 3.4–10.8)

## 2018-01-23 LAB — COMPREHENSIVE METABOLIC PANEL
ALT: 14 IU/L (ref 0–44)
AST: 15 IU/L (ref 0–40)
Albumin/Globulin Ratio: 1.7 (ref 1.2–2.2)
Albumin: 4.3 g/dL (ref 3.6–4.8)
Alkaline Phosphatase: 41 IU/L (ref 39–117)
BUN/Creatinine Ratio: 15 (ref 10–24)
BUN: 14 mg/dL (ref 8–27)
Bilirubin Total: 0.8 mg/dL (ref 0.0–1.2)
CALCIUM: 9 mg/dL (ref 8.6–10.2)
CO2: 24 mmol/L (ref 20–29)
Chloride: 104 mmol/L (ref 96–106)
Creatinine, Ser: 0.91 mg/dL (ref 0.76–1.27)
GFR calc Af Amer: 105 mL/min/{1.73_m2} (ref >59)
GFR, EST NON AFRICAN AMERICAN: 91 mL/min/{1.73_m2} (ref >59)
Globulin, Total: 2.5 g/dL (ref 1.5–4.5)
Glucose: 89 mg/dL (ref 65–99)
Potassium: 3.8 mmol/L (ref 3.5–5.2)
Sodium: 142 mmol/L (ref 134–144)
Total Protein: 6.8 g/dL (ref 6.0–8.5)

## 2018-01-23 LAB — LIPID PANEL
Chol/HDL Ratio: 2.1 ratio (ref 0.0–5.0)
Cholesterol, Total: 137 mg/dL (ref 100–199)
HDL: 66 mg/dL (ref >39)
LDL Calculated: 58 mg/dL (ref 0–99)
Triglycerides: 65 mg/dL (ref 0–149)
VLDL CHOLESTEROL CAL: 13 mg/dL (ref 5–40)

## 2018-01-23 LAB — HEMOGLOBIN A1C
ESTIMATED AVERAGE GLUCOSE: 103 mg/dL
Hgb A1c MFr Bld: 5.2 % (ref 4.8–5.6)

## 2018-01-23 LAB — TSH: TSH: 2.24 u[IU]/mL (ref 0.450–4.500)

## 2018-01-23 LAB — PSA: Prostate Specific Ag, Serum: 0.7 ng/mL (ref 0.0–4.0)

## 2018-01-24 ENCOUNTER — Encounter: Payer: Self-pay | Admitting: Gastroenterology

## 2018-06-19 ENCOUNTER — Ambulatory Visit: Payer: BLUE CROSS/BLUE SHIELD | Admitting: Emergency Medicine

## 2018-09-09 ENCOUNTER — Other Ambulatory Visit: Payer: Self-pay

## 2018-09-09 ENCOUNTER — Emergency Department (HOSPITAL_COMMUNITY)
Admission: EM | Admit: 2018-09-09 | Discharge: 2018-09-09 | Disposition: A | Discharge status: Home / Self Care | Payer: BLUE CROSS/BLUE SHIELD | Attending: Emergency Medicine | Admitting: Emergency Medicine

## 2018-09-09 ENCOUNTER — Encounter (HOSPITAL_COMMUNITY): Payer: Self-pay | Admitting: *Deleted

## 2018-09-09 ENCOUNTER — Ambulatory Visit: Payer: Self-pay

## 2018-09-09 DIAGNOSIS — Z20822 Contact with and (suspected) exposure to covid-19: Secondary | ICD-10-CM

## 2018-09-09 DIAGNOSIS — J029 Acute pharyngitis, unspecified: Secondary | ICD-10-CM | POA: Diagnosis present

## 2018-09-09 DIAGNOSIS — R0981 Nasal congestion: Secondary | ICD-10-CM | POA: Diagnosis not present

## 2018-09-09 DIAGNOSIS — Z20828 Contact with and (suspected) exposure to other viral communicable diseases: Secondary | ICD-10-CM | POA: Diagnosis not present

## 2018-09-09 NOTE — ED Provider Notes (Signed)
[facility] DEPT Provider Note   CSN: [id number] Arrival date & time: 09/09/18  1221    History   Chief Complaint Chief Complaint  Patient presents with  . Sore Throat  . Nasal Congestion  . exposure to covid 19    HPI Logyn Riviera is a 63 y.o. male.     HPI Patient presented to the emergency room for evaluation of possible covid illness.  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  He lost saw them about 4 days ago.  Patient states he started to have a little bit of a sore throat and some slight nasal congestion for the past couple of days.  He has not been coughing.  He has not had a fever.  He denies any myalgias.  No difficulties with his smell or taste.  He is not having any vomiting diarrhea.  Patient is concerned because he does have family members he lives with at home. Past Medical History:  Diagnosis Date  . Heart murmur   . Seizures ([facility]) 40 years ago    none since then per pt  . Sleep apnea    CPAP     Patient Active Problem List   Diagnosis Date Noted  . Degeneration of lumbar or lumbosacral intervertebral disc 07/30/2013  . INGUINAL HERNIORRHAPHY, LEFT, HX OF 03/16/2008    Past Surgical History:  Procedure Laterality Date  . COLONOSCOPY  07/20/2005   Dr.Kaplan=normal exam   . HERNIA REPAIR    . KNEE SURGERY Left    6-7 years ago        Home Medications    Prior to Admission medications   Medication Sig Start Date End Date Taking? Authorizing Provider  Ciclopirox 0.77 % gel  01/09/18   [provider]    Family History Family History  Problem Relation Age of Onset  . Diabetes Father   . Cancer Sister        stomach  . Stomach cancer Sister   . Colon cancer Neg Hx   . Esophageal cancer Neg Hx   . Rectal cancer Neg Hx     Social History Social History   Tobacco Use  . Smoking status: Never Smoker  . Smokeless tobacco: Never Used  Substance Use  Topics  . Alcohol use: No  . Drug use: No     Allergies   Patient has no known allergies.   Review of Systems Review of Systems  All other systems reviewed and are negative.    Physical Exam Updated Vital Signs BP 136/72 (BP Location: Left Arm)   Pulse 66   Temp 98.2 F (36.8 C) (Oral)   Resp 18   SpO2 98%   Physical Exam Vitals signs and nursing note reviewed.  Constitutional:      General: He is not in acute distress.    Appearance: He is well-developed.  HENT:     Head: Normocephalic and atraumatic.     Right Ear: External ear normal.     Left Ear: External ear normal.     Mouth/Throat:     Mouth: Mucous membranes are moist.  Eyes:     General: No scleral icterus.       Right eye: No discharge.        Left eye: No discharge.     Conjunctiva/sclera: Conjunctivae normal.  Neck:     Musculoskeletal: Neck supple.     Trachea: No tracheal deviation.  Cardiovascular:     Rate and Rhythm: Normal rate and regular rhythm.  Pulmonary:     Effort: Pulmonary effort is normal. No respiratory distress.     Breath sounds: Normal breath sounds. No stridor. No wheezing or rales.  Abdominal:     General: Bowel sounds are normal. There is no distension.     Palpations: Abdomen is soft.     Tenderness: There is no abdominal tenderness. There is no guarding or rebound.  Musculoskeletal:        General: No tenderness.  Skin:    General: Skin is warm and dry.     Findings: No rash.  Neurological:     Mental Status: He is alert.     Cranial Nerves: No cranial nerve deficit (no facial droop, extraocular movements intact, no slurred speech).     Sensory: No sensory deficit.     Motor: No abnormal muscle tone or seizure activity.     Coordination: Coordination normal.      ED Treatments / Results  Labs (all labs ordered are listed, but only abnormal results are displayed) Labs Reviewed - No data to display  EKG None  Radiology No results found.  Procedures  Procedures (including critical care time)  Medications Ordered in ED Medications - No data to display   Initial Impression / Assessment and Plan / ED Course  I have reviewed the triage vital signs and the nursing notes.  Pertinent labs & imaging results that were available during my care of the patient were reviewed by me and considered in my medical decision making (see chart for details).    Patient presents emergency room with concerns of possible covid 19 exposure.  Patient has very mild symptoms right now fortunately.  He is not short of breath.  He has not been coughing.  He does have a mild sore throat.  It is certainly possible the patient has early COVID-19.  I discussed supportive care treatment.  I discussed self quarantining.  Patient is stable for discharge.  Elmer Merwin was evaluated in Emergency Department on 09/09/2018 for the symptoms described in the history of present illness. He was evaluated in the context of the global COVID-19 pandemic, which necessitated consideration that the patient might be at risk for infection with the SARS-CoV-2 virus that causes COVID-19. Institutional protocols and algorithms that pertain to the evaluation of patients at risk for COVID-19 are in a state of rapid change based on information released by regulatory bodies including the CDC and federal and state organizations. These policies and algorithms were followed during the patient's care in the ED.    Final Clinical Impressions(s) / ED Diagnoses   Final diagnoses:  Close Exposure to Covid-19 Virus    ED Discharge Orders    None       Dorie Rank, MD 09/09/18 1330

## 2018-09-09 NOTE — Telephone Encounter (Signed)
Phone call from pt.  Reported onset of sore throat and runny nose today.  Reported he was exposed to [place] [employer], as 75 coworkers were recently diagnosed with it; one coworker was just diagnosed 2 days ago.  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  Pt. Denied fever, cough, or shortness of breath.  Questioned about having test to check for Coronavirus?  Advised that routine testing is not being done as an outpatient.   Advised on importance of self-quarantining x 14 days, treating symptoms, covering mouth and nose with cough/ sneeze, distancing self from family members, frequent handwashing, disinfecting high touch areas of home.  Pt. Voiced concerns about wife being exposed to him.  Advised that she should quarantine 7 days from onset of any symptoms and > 72 hrs. after resolutions of any symptoms.   Pt. Stated he feels his wife should also quarantine for 14 days.  Advised pt., if she is able to do so, that would be the best approach.  Advised pt. If worsening symptoms, ie: shortness of breath/ chest tightness, to go to ER.  Verb. Understanding.  Agrees with plan.  Will send note to provider to make aware of recent exposure to COVID 19, and current symptoms.         Reason for Disposition . [1] COVID-19 EXPOSURE (Close Contact) within last 14 days AND [2] NO cough, fever, or breathing difficulty . [1] COVID-19 EXPOSURE within last 14 days AND [2] mild body aches, chills, diarrhea, headache, runny nose, or sore throat occur  Answer Assessment - Initial Assessment Questions 1. CONFIRMED CASE: "Who is the person with the confirmed COVID-19 infection that you were exposed to?"     Coworkers x 4  2. PLACE of CONTACT: "Where were you when you were exposed to COVID-19  (coronavirus disease 2019)?" (e.g., city, state, country)     Work environment 3. 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 DATE of CONTACT: "When did you have contact with a coronavirus patient?" (e.g., days)     2 days ago ; 3/29 5. DURATION of CONTACT: "How long were you in contact with the COVID-19 (coronavirus disease) patient?" (e.g., a few seconds, passed by person, a few minutes, live with the patient)     See #3 6. SYMPTOMS: "Do you have any symptoms?" (e.g., fever, cough, breathing difficulty)    c/o Sore throat and runny nose with clear to green mucus, denied fever, denied cough, denied shortness of breath, denied chest tightness  7. PREGNANCY OR POSTPARTUM: "Is there any chance you are pregnant?" "When was your last menstrual period?" "Did you deliver in the last 2 weeks?"    n/a 8. HIGH RISK: "Do you have any heart or lung problems? Do you have a weakened immune system?" (e.g., CHF, COPD, asthma, HIV positive, chemotherapy, renal failure, diabetes mellitus, sickle cell anemia)     Denied any chronic health problems  Protocols used: CORONAVIRUS (COVID-19) EXPOSURE-A-AH

## 2018-09-09 NOTE — ED Triage Notes (Addendum)
Pt reports close contact with 4 people who tested positive for Covid 19. He last saw them 4 days ago. The pt reports sore throat and nasal congestion for the past 2 days. He is concerned he has covid 19. Pt denies fever, cough, shortness of breath.

## 2018-09-09 NOTE — Telephone Encounter (Signed)
Spoke with pt and advised him that if he has symptoms and has been exposed he needs to go over to the ER for evaluation.

## 2018-09-09 NOTE — Discharge Instructions (Addendum)
Coronavirus (COVID-19) Are you at risk?  Are you at risk for the Coronavirus (COVID-19)?  To be considered HIGH RISK for Coronavirus (COVID-19), you have to meet the following criteria: Traveled to [place], [place], [place], [place] or [place]; or in the [place] to [place], [place], [place], or [place]; and have fever, cough, and shortness of breath within the last 2 weeks of travel OR Been in close contact with a person diagnosed with COVID-19 within the last 2 weeks and have fever, cough, and shortness of breath IF YOU DO NOT MEET THESE CRITERIA, YOU ARE CONSIDERED LOW RISK FOR COVID-19.  What to do if you are HIGH RISK for COVID-19?  If you are having a medical emergency, call 911. Seek medical care right away. Before you go to a doctors office, urgent care or emergency department, call ahead and tell them about your recent travel, contact with someone diagnosed with COVID-19, and your symptoms. You should receive instructions from your physicians office regarding next steps of care.  When you arrive at healthcare provider, tell the healthcare staff immediately you have returned from visiting [place], [place], [place], [place] or [place]; or traveled in the [place] to [place], [place], [place], or [place]; in the last two weeks or you have been in close contact with a person diagnosed with COVID-19 in the last 2 weeks.   Tell the health care staff about your symptoms: fever, cough and shortness of breath. After you have been seen by a medical provider, you will be either: Tested for (COVID-19) and discharged home on quarantine except to seek medical care if symptoms worsen, and asked to  Stay home and avoid contact with others until you get your results (4-5 days)  Avoid travel on public transportation if possible (such as bus, train, or airplane) or Sent to the Emergency Department by EMS for evaluation, COVID-19 testing, and possible admission depending on your  condition and test results.   What to do if you are LOW RISK for COVID-19?  Reduce your risk of any infection by using the same precautions used for avoiding the common cold or flu:  Wash your hands often with soap and warm water for at least 20 seconds.  If soap and water are not readily available, use an alcohol-based hand sanitizer with at least 60% alcohol.  If coughing or sneezing, cover your mouth and nose by coughing or sneezing into the elbow areas of your shirt or coat, into a tissue or into your sleeve (not your hands). Avoid shaking hands with others and consider head nods or verbal greetings only. Avoid touching your eyes, nose, or mouth with unwashed hands.  Avoid close contact with people who are sick. Avoid places or events with large numbers of people in one location, like concerts or sporting events. Carefully consider travel plans you have or are making. If you are planning any travel outside or inside the [place], visit the [facility] webpage for the latest health notices. If you have some symptoms but not all symptoms, continue to monitor at home and seek medical attention if your symptoms worsen. If you are having a medical emergency, call 911.   [facility] / e-Visit:  [email or web address]           [facility]: [facility]: [phone]  [facility]: [phone]     Person Under Monitoring Name: Alexander George  Location: [address] [place] [address]   CORONAVIRUS DISEASE 2019 (COVID-19) Guidance for Persons Under Investigation Public health actions are necessary to ensure protection of your health and the health of others, and to prevent further spread of infection. COVID-19 is caused by a virus that can cause symptoms, such as fever, cough, and shortness of breath.  The primary transmission from person to person is by coughing or sneezing. On July 10, 2018, the [facility] announced a [employer] Emergency of International Concern and on July 11, 2018 the U.S. Department of Health and Human Services declared a public health emergency. If the virus that causesCOVID-19 spreads in the community, it could have severe public health consequences.  As a person under investigation for COVID-19, the [facility] advises you to adhere to the following guidance .  Remain at home until you are cleared by your health provider or public health authorities. Guidelines now are 7 days from symptoms onset and 72 hours of being symptoms free  Keep a log of visitors to your home using the form provided. Any visitors to your home must be aware of your isolation status. If you plan to move to a new address or leave the county, notify the local health department in your county. Call a doctor or seek care if you have an urgent medical need. Before seeking medical care, call ahead and get instructions from the provider before arriving at the medical office, clinic or hospital. Notify them that you are being tested for the virus that causes COVID-19 so arrangements can be made, as necessary, to prevent transmission to others in the healthcare setting. Next, notify the local health department in your county. If a medical emergency arises and you need to call 911, inform the first responders that you are being tested for the virus that causes COVID-19. Next, notify the local health department in your county. Adhere to all guidance set forth by the [facility] for [facility] of patients that is based on guidance from the Center for Disease Control and Prevention with suspected or confirmed COVID-19. It is provided with this guidance for Persons Under Investigation.  Your health  and the health of our community are our top priorities. Public Health officials remain available to provide assistance and counseling to you about COVID-19 and compliance with this guidance.  Provider: ____________________________________________________________ Date: ______/_____/_________  By signing below, you acknowledge that you have read and agree to comply with this Guidance for Persons Under Investigation. ______________________________________________________________ Date: ______/_____/_________  WHO DO I CALL? You can find a list of local health departments here: [email or web address] Health Department: ____________________________________________________________________ Contact Name: ________________________________________________________________________ Telephone: ___________________________________________________________________________  Marice Potter, [place], Communicable Disease Branch COVID-19 Guidance for Persons Under Investigation August 16, 2018

## 2018-09-09 NOTE — ED Notes (Signed)
Bed: PZ98 Expected date:  Expected time:  Means of arrival:  Comments: Negative pressure

## 2018-09-11 ENCOUNTER — Telehealth: Payer: Self-pay | Admitting: Emergency Medicine

## 2018-09-11 NOTE — Telephone Encounter (Signed)
Patient called and says he went to the ED on 09/09/18 for exposure at work to COVID-19 and was told to self isolate for 7 days. He says now the number of people where he works has increased and he wants to be tested for COVID-19 and be in isolation for 14 days that this is serious, he still has a sore throat and diarrhea sometimes. I called the office and spoke to [place], [facility] who placed me on hold to get one of the clinical staff. Mingo Amber answered the phone and asked to speak to the patient, call was conference and transferred.

## 2018-09-11 NOTE — Telephone Encounter (Addendum)
Received call below.  Pt states he was told to self-isolate x 7 days but is concerned he does have COVID and no test was performed.  C/o runny nose, sore throat, and diarrhea.  Does not c/o SOB.  Advised pt to go directly to ED if he has SOB.  Pt wants to discuss with provider as he does not feel 7 days from 3/31 is enough time based on what he has heard.  Is aware that clinic is not performing testing.  Willing to schedule Webex.

## 2019-01-27 ENCOUNTER — Ambulatory Visit: Payer: BLUE CROSS/BLUE SHIELD | Admitting: Registered Nurse

## 2019-01-27 ENCOUNTER — Other Ambulatory Visit: Payer: Self-pay

## 2019-02-02 ENCOUNTER — Encounter: Payer: Self-pay | Admitting: Emergency Medicine

## 2019-02-02 ENCOUNTER — Other Ambulatory Visit: Payer: Self-pay

## 2019-02-02 ENCOUNTER — Ambulatory Visit (INDEPENDENT_AMBULATORY_CARE_PROVIDER_SITE_OTHER): Payer: BC Managed Care – PPO | Admitting: Emergency Medicine

## 2019-02-02 VITALS — BP 105/66 | HR 72 | Temp 98.6°F | Resp 16 | Wt 166.4 lb

## 2019-02-02 DIAGNOSIS — Z23 Encounter for immunization: Secondary | ICD-10-CM

## 2019-02-02 DIAGNOSIS — R079 Chest pain, unspecified: Secondary | ICD-10-CM

## 2019-02-02 DIAGNOSIS — R42 Dizziness and giddiness: Secondary | ICD-10-CM

## 2019-02-02 DIAGNOSIS — R399 Unspecified symptoms and signs involving the genitourinary system: Secondary | ICD-10-CM

## 2019-02-02 DIAGNOSIS — M79672 Pain in left foot: Secondary | ICD-10-CM

## 2019-02-02 DIAGNOSIS — M79671 Pain in right foot: Secondary | ICD-10-CM

## 2019-02-02 NOTE — Progress Notes (Signed)
Alexander George 63 y.o.   Chief Complaint  Patient presents with   Chest Pain    and dizziness x 2-3 weeks   Foot Pain    both since last Sunday    HISTORY OF PRESENT ILLNESS: This is a 63 y.o. male complaining of 3 problems: 1.  Intermittent left-sided chest pain.  Sharp pain with no radiation and no associated symptoms.  No cardiac history.  Denies chest pain or dyspnea on exertion.  Started about 2 to 3 weeks ago.  Gets occasional dizziness but is mostly when he gets up quickly from a supine position. 2.  Lower urinary tract symptoms with nocturia of 4-5 episodes per night.  Has a feeling that he is unable to empty bladder fully. 3.  Bilateral heel pain for several months. No other complaints or medical concerns.  HPI   Prior to Admission medications   Medication Sig Start Date End Date Taking? Authorizing Provider  Ciclopirox 0.77 % gel  01/09/18   [provider]    No Known Allergies  Patient Active Problem List   Diagnosis Date Noted   Degeneration of lumbar or lumbosacral intervertebral disc 07/30/2013    Past Medical History:  Diagnosis Date   Heart murmur    Seizures ([place]) 40 years ago    none since then per pt   Sleep apnea    CPAP     Past Surgical History:  Procedure Laterality Date   COLONOSCOPY  07/20/2005   Dr.Kaplan=normal exam    HERNIA REPAIR     KNEE SURGERY Left    6-7 years ago    Social History   Socioeconomic History   Marital status: Married    Spouse name: Not on file   Number of children: Not on file   Years of education: Not on file   Highest education level: Not on file  Occupational History   Not on file  Social Needs   Financial resource strain: Not on file   Food insecurity    Worry: Not on file    Inability: Not on file   Transportation needs    Medical: Not on file    Non-medical: Not on file  Tobacco Use   Smoking status: Never Smoker   Smokeless tobacco: Never Used  Substance and  Sexual Activity   Alcohol use: No   Drug use: No   Sexual activity: Not on file  Lifestyle   Physical activity    Days per week: Not on file    Minutes per session: Not on file   Stress: Not on file  Relationships   Social connections    Talks on phone: Not on file    Gets together: Not on file    Attends religious service: Not on file    Active member of club or organization: Not on file    Attends meetings of clubs or organizations: Not on file    Relationship status: Not on file   Intimate partner violence    Fear of current or ex partner: Not on file    Emotionally abused: Not on file    Physically abused: Not on file    Forced sexual activity: Not on file  Other Topics Concern   Not on file  Social History Narrative   Not on file    Family History  Problem Relation Age of Onset   Diabetes Father    Cancer Sister        stomach   Stomach cancer  Sister    Colon cancer Neg Hx    Esophageal cancer Neg Hx    Rectal cancer Neg Hx      Review of Systems  Constitutional: Negative.  Negative for chills and fever.  HENT: Negative.  Negative for congestion and sore throat.   Eyes: Negative.   Respiratory: Negative.  Negative for cough and shortness of breath.   Cardiovascular: Positive for chest pain. Negative for palpitations and leg swelling.  Gastrointestinal: Negative for abdominal pain, diarrhea, nausea and vomiting.  Genitourinary: Negative.  Negative for dysuria and hematuria.  Musculoskeletal: Negative.  Negative for back pain, myalgias and neck pain.       Bilateral heel pain  Skin: Negative.  Negative for rash.  Neurological: Positive for dizziness. Negative for headaches.  All other systems reviewed and are negative.  Vitals:   02/02/19 1627  BP: 105/66  Pulse: 72  Resp: 16  Temp: 98.6 F (37 C)  SpO2: 97%     Physical Exam Vitals signs reviewed.  Constitutional:      Appearance: He is well-developed.  HENT:     Head:  Normocephalic.  Eyes:     Extraocular Movements: Extraocular movements intact.     Conjunctiva/sclera: Conjunctivae normal.     Pupils: Pupils are equal, round, and reactive to light.  Neck:     Musculoskeletal: Normal range of motion and neck supple.  Cardiovascular:     Rate and Rhythm: Normal rate and regular rhythm.     Pulses: Normal pulses.     Heart sounds: Normal heart sounds.  Pulmonary:     Effort: Pulmonary effort is normal.     Breath sounds: Normal breath sounds.  Abdominal:     Palpations: Abdomen is soft.     Tenderness: There is no abdominal tenderness.  Musculoskeletal: Normal range of motion.        General: No swelling.     Right lower leg: No edema.     Left lower leg: No edema.     Comments: Feet: No erythema or ecchymosis.  No significant swelling or tenderness.  Full range of motion.  Neurovascularly intact.  Excellent capillary refill.  Skin:    General: Skin is warm and dry.     Capillary Refill: Capillary refill takes less than 2 seconds.  Neurological:     General: No focal deficit present.     Mental Status: He is alert and oriented to person, place, and time.     Sensory: No sensory deficit.     Motor: No weakness.     Coordination: Coordination normal.     Gait: Gait normal.  Psychiatric:        Mood and Affect: Mood normal.        Behavior: Behavior normal.    EKG: Normal sinus rhythm with ventricular rate of 70.  No acute ischemic changes.  Within normal limits.  A total of 40 minutes was spent in the room with the patient, greater than 50% of which was in counseling/coordination of care regarding differential diagnosis, review of EKG, need for additional work-up including cardiology evaluation, urology evaluation, and podiatry assessment.  Discussed prognosis, treatment, and need for follow-up.  ASSESSMENT & PLAN: Caster was seen today for chest pain and foot pain.  Diagnoses and all orders for this visit:  Nonspecific chest pain -      CBC with Differential/Platelet -     Comprehensive metabolic panel -     Hemoglobin A1c -  EKG 12-Lead -     Ambulatory referral to Cardiology  Lower urinary tract symptoms (LUTS) -     PSA -     Ambulatory referral to Urology  Dizziness -     CBC with Differential/Platelet -     Comprehensive metabolic panel -     Hemoglobin A1c  Need for prophylactic vaccination and inoculation against influenza -     Flu Vaccine QUAD 36+ mos IM  Heel pain, bilateral -     Ambulatory referral to Podiatry    Patient Instructions       If you have lab work done today you will be contacted with your lab results within the next 2 weeks.  If you have not heard from [place] then please contact us. The fastest way to get your results is to register for My Chart.   IF you received an x-ray today, you will receive an invoice from [facility]. Please contact [facility] at [phone] with questions or concerns regarding your invoice.   IF you received labwork today, you will receive an invoice from [place]. Please contact [facility] at [phone] with questions or concerns regarding your invoice.   Our billing staff will not be able to assist you with questions regarding bills from these companies.  You will be contacted with the lab results as soon as they are available. The fastest way to get your results is to activate your My Chart account. Instructions are located on the last page of this paperwork. If you have not heard from [place] regarding the results in 2 weeks, please contact this office.     Nonspecific Chest Pain Chest pain can be caused by many different conditions. Some causes of chest pain can be life-threatening. These will require treatment right away. Serious causes of chest pain include:  Heart attack.  A tear in the body's main blood vessel.  Redness and swelling (inflammation) around your heart.  Blood clot in your lungs. Other causes of chest pain may  not be so serious. These include:  Heartburn.  Anxiety or stress.  Damage to bones or muscles in your chest.  Lung infections. Chest pain can feel like:  Pain or discomfort in your chest.  Crushing, pressure, aching, or squeezing pain.  Burning or tingling.  Dull or sharp pain that is worse when you move, cough, or take a deep breath.  Pain or discomfort that is also felt in your back, neck, jaw, shoulder, or arm, or pain that spreads to any of these areas. It is hard to know whether your pain is caused by something that is serious or something that is not so serious. So it is important to see your doctor right away if you have chest pain. Follow these instructions at home: Medicines  Take over-the-counter and prescription medicines only as told by your doctor.  If you were prescribed an antibiotic medicine, take it as told by your doctor. Do not stop taking the antibiotic even if you start to feel better. Lifestyle   Rest as told by your doctor.  Do not use any products that contain nicotine or tobacco, such as cigarettes, e-cigarettes, and chewing tobacco. If you need help quitting, ask your doctor.  Do not drink alcohol.  Make lifestyle changes as told by your doctor. These may include: ? Getting regular exercise. Ask your doctor what activities are safe for you. ? Eating a heart-healthy diet. A diet and nutrition specialist (dietitian) can help you to learn healthy eating  options. ? Staying at a healthy weight. ? Treating diabetes or high blood pressure, if needed. ? Lowering your stress. Activities such as yoga and relaxation techniques can help. General instructions  Pay attention to any changes in your symptoms. Tell your doctor about them or any new symptoms.  Avoid any activities that cause chest pain.  Keep all follow-up visits as told by your doctor. This is important. You may need more testing if your chest pain does not go away. Contact a doctor  if:  Your chest pain does not go away.  You feel depressed.  You have a fever. Get help right away if:  Your chest pain is worse.  You have a cough that gets worse, or you cough up blood.  You have very bad (severe) pain in your belly (abdomen).  You pass out (faint).  You have either of these for no clear reason: ? Sudden chest discomfort. ? Sudden discomfort in your arms, back, neck, or jaw.  You have shortness of breath at any time.  You suddenly start to sweat, or your skin gets clammy.  You feel sick to your stomach (nauseous).  You throw up (vomit).  You suddenly feel lightheaded or dizzy.  You feel very weak or tired.  Your heart starts to beat fast, or it feels like it is skipping beats. These symptoms may be an emergency. Do not wait to see if the symptoms will go away. Get medical help right away. Call your local emergency services (911 in the [place] Do not drive yourself to the hospital. Summary  Chest pain can be caused by many different conditions. The cause may be serious and need treatment right away. If you have chest pain, see your doctor right away.  Follow your doctor's instructions for taking medicines and making lifestyle changes.  Keep all follow-up visits as told by your doctor. This includes visits for any further testing if your chest pain does not go away.  Be sure to know the signs that show that your condition has become worse. Get help right away if you have these symptoms. This information is not intended to replace advice given to you by your health care provider. Make sure you discuss any questions you have with your health care provider. Document Released: 11/14/2007 Document Revised: 11/28/2017 Document Reviewed: 11/28/2017 Elsevier Patient Education  2020 Elsevier Inc.      Agustina Caroli, MD [facility]

## 2019-02-02 NOTE — Patient Instructions (Addendum)
   If you have lab work done today you will be contacted with your lab results within the next 2 weeks.  If you have not heard from us then please contact us. The fastest way to get your results is to register for My Chart.   IF you received an x-ray today, you will receive an invoice from [place]. Please contact [facility] at [phone] with questions or concerns regarding your invoice.   IF you received labwork today, you will receive an invoice from [facility]. Please contact [facility] at [phone] with questions or concerns regarding your invoice.   Our billing staff will not be able to assist you with questions regarding bills from these companies.  You will be contacted with the lab results as soon as they are available. The fastest way to get your results is to activate your My Chart account. Instructions are located on the last page of this paperwork. If you have not heard from us regarding the results in 2 weeks, please contact this office.     Nonspecific Chest Pain Chest pain can be caused by many different conditions. Some causes of chest pain can be life-threatening. These will require treatment right away. Serious causes of chest pain include:  Heart attack.  A tear in the body's main blood vessel.  Redness and swelling (inflammation) around your heart.  Blood clot in your lungs. Other causes of chest pain may not be so serious. These include:  Heartburn.  Anxiety or stress.  Damage to bones or muscles in your chest.  Lung infections. Chest pain can feel like:  Pain or discomfort in your chest.  Crushing, pressure, aching, or squeezing pain.  Burning or tingling.  Dull or sharp pain that is worse when you move, cough, or take a deep breath.  Pain or discomfort that is also felt in your back, neck, jaw, shoulder, or arm, or pain that spreads to any of these areas. It is hard to know whether your pain is caused by something that is  serious or something that is not so serious. So it is important to see your doctor right away if you have chest pain. Follow these instructions at home: Medicines  Take over-the-counter and prescription medicines only as told by your doctor.  If you were prescribed an antibiotic medicine, take it as told by your doctor. Do not stop taking the antibiotic even if you start to feel better. Lifestyle   Rest as told by your doctor.  Do not use any products that contain nicotine or tobacco, such as cigarettes, e-cigarettes, and chewing tobacco. If you need help quitting, ask your doctor.  Do not drink alcohol.  Make lifestyle changes as told by your doctor. These may include: ? Getting regular exercise. Ask your doctor what activities are safe for you. ? Eating a heart-healthy diet. A diet and nutrition specialist (dietitian) can help you to learn healthy eating options. ? Staying at a healthy weight. ? Treating diabetes or high blood pressure, if needed. ? Lowering your stress. Activities such as yoga and relaxation techniques can help. General instructions  Pay attention to any changes in your symptoms. Tell your doctor about them or any new symptoms.  Avoid any activities that cause chest pain.  Keep all follow-up visits as told by your doctor. This is important. You may need more testing if your chest pain does not go away. Contact a doctor if:  Your chest pain does not go away.  You feel   depressed.  You have a fever. Get help right away if:  Your chest pain is worse.  You have a cough that gets worse, or you cough up blood.  You have very bad (severe) pain in your belly (abdomen).  You pass out (faint).  You have either of these for no clear reason: ? Sudden chest discomfort. ? Sudden discomfort in your arms, back, neck, or jaw.  You have shortness of breath at any time.  You suddenly start to sweat, or your skin gets clammy.  You feel sick to your stomach  (nauseous).  You throw up (vomit).  You suddenly feel lightheaded or dizzy.  You feel very weak or tired.  Your heart starts to beat fast, or it feels like it is skipping beats. These symptoms may be an emergency. Do not wait to see if the symptoms will go away. Get medical help right away. Call your local emergency services (911 in the [place] Do not drive yourself to the hospital. Summary  Chest pain can be caused by many different conditions. The cause may be serious and need treatment right away. If you have chest pain, see your doctor right away.  Follow your doctor's instructions for taking medicines and making lifestyle changes.  Keep all follow-up visits as told by your doctor. This includes visits for any further testing if your chest pain does not go away.  Be sure to know the signs that show that your condition has become worse. Get help right away if you have these symptoms. This information is not intended to replace advice given to you by your health care provider. Make sure you discuss any questions you have with your health care provider. Document Released: 11/14/2007 Document Revised: 11/28/2017 Document Reviewed: 11/28/2017 Elsevier Patient Education  2020 [employer].

## 2019-02-03 ENCOUNTER — Telehealth: Payer: Self-pay | Admitting: Emergency Medicine

## 2019-02-03 LAB — COMPREHENSIVE METABOLIC PANEL
ALT: 11 IU/L (ref 0–44)
AST: 19 IU/L (ref 0–40)
Albumin/Globulin Ratio: 1.8 (ref 1.2–2.2)
Albumin: 4.6 g/dL (ref 3.8–4.8)
Alkaline Phosphatase: 41 IU/L (ref 39–117)
BUN/Creatinine Ratio: 23 (ref 10–24)
BUN: 21 mg/dL (ref 8–27)
Bilirubin Total: 0.6 mg/dL (ref 0.0–1.2)
CO2: 25 mmol/L (ref 20–29)
Calcium: 9.2 mg/dL (ref 8.6–10.2)
Chloride: 103 mmol/L (ref 96–106)
Creatinine, Ser: 0.9 mg/dL (ref 0.76–1.27)
GFR calc Af Amer: 105 mL/min/{1.73_m2} (ref >59)
GFR calc non Af Amer: 91 mL/min/{1.73_m2} (ref >59)
Globulin, Total: 2.5 g/dL (ref 1.5–4.5)
Glucose: 87 mg/dL (ref 65–99)
Potassium: 4.4 mmol/L (ref 3.5–5.2)
Sodium: 141 mmol/L (ref 134–144)
Total Protein: 7.1 g/dL (ref 6.0–8.5)

## 2019-02-03 LAB — CBC WITH DIFFERENTIAL/PLATELET
Basophils Absolute: 0 10*3/uL (ref 0.0–0.2)
Basos: 1 %
EOS (ABSOLUTE): 0.1 10*3/uL (ref 0.0–0.4)
Eos: 2 %
Hematocrit: 42.6 % (ref 37.5–51.0)
Hemoglobin: 14.2 g/dL (ref 13.0–17.7)
Immature Grans (Abs): 0 10*3/uL (ref 0.0–0.1)
Immature Granulocytes: 0 %
Lymphocytes Absolute: 1.7 10*3/uL (ref 0.7–3.1)
Lymphs: 30 %
MCH: 29.3 pg (ref 26.6–33.0)
MCHC: 33.3 g/dL (ref 31.5–35.7)
MCV: 88 fL (ref 79–97)
Monocytes Absolute: 0.4 10*3/uL (ref 0.1–0.9)
Monocytes: 6 %
Neutrophils Absolute: 3.3 10*3/uL (ref 1.4–7.0)
Neutrophils: 61 %
Platelets: 190 10*3/uL (ref 150–450)
RBC: 4.84 x10E6/uL (ref 4.14–5.80)
RDW: 13.6 % (ref 11.6–15.4)
WBC: 5.5 10*3/uL (ref 3.4–10.8)

## 2019-02-03 LAB — HEMOGLOBIN A1C
Est. average glucose Bld gHb Est-mCnc: 103 mg/dL
Hgb A1c MFr Bld: 5.2 % (ref 4.8–5.6)

## 2019-02-03 LAB — PSA: Prostate Specific Ag, Serum: 0.9 ng/mL (ref 0.0–4.0)

## 2019-02-03 NOTE — Telephone Encounter (Signed)
Informed pt of labs, verbalized understanding.

## 2019-02-03 NOTE — Telephone Encounter (Signed)
Copied from [place] [id number]. Topic: Quick Communication - Lab Results (Clinic Use ONLY) >> Feb 03, 2019 12:20 PM Scherrie Gerlach wrote: Pt calling back for lab results. Pt say ok to LM if you do not get him

## 2019-02-09 ENCOUNTER — Ambulatory Visit (INDEPENDENT_AMBULATORY_CARE_PROVIDER_SITE_OTHER): Payer: BC Managed Care – PPO

## 2019-02-09 ENCOUNTER — Encounter: Payer: Self-pay | Admitting: Radiology

## 2019-02-09 ENCOUNTER — Encounter: Payer: Self-pay | Admitting: Podiatry

## 2019-02-09 ENCOUNTER — Other Ambulatory Visit: Payer: Self-pay

## 2019-02-09 ENCOUNTER — Ambulatory Visit: Payer: BC Managed Care – PPO | Admitting: Podiatry

## 2019-02-09 VITALS — BP 121/56

## 2019-02-09 DIAGNOSIS — M2142 Flat foot [pes planus] (acquired), left foot: Secondary | ICD-10-CM

## 2019-02-09 DIAGNOSIS — M722 Plantar fascial fibromatosis: Secondary | ICD-10-CM

## 2019-02-09 DIAGNOSIS — M79672 Pain in left foot: Secondary | ICD-10-CM | POA: Diagnosis not present

## 2019-02-09 DIAGNOSIS — M2141 Flat foot [pes planus] (acquired), right foot: Secondary | ICD-10-CM

## 2019-02-09 DIAGNOSIS — M79671 Pain in right foot: Secondary | ICD-10-CM | POA: Diagnosis not present

## 2019-02-09 MED ORDER — MELOXICAM 15 MG PO TABS: 15.0000 mg | ORAL_TABLET | Freq: Every day | ORAL | 0 refills | Status: DC

## 2019-02-09 NOTE — Progress Notes (Signed)
Subjective:   Patient ID: Alexander George, male   DOB: 63 y.o.   MRN: [id number]   HPI 63 year old male presents the office today for concerns of bilateral foot pain mostly to the heel of his foot but also goes down towards the ball of his foot.  He states is been ongoing for last couple years.  He states that it hurts when he first gets up and after he does a lot of walking.  Denies any swelling or redness and no recent injury or trauma.  No radiating pain.  He said no recent treatment.   Review of Systems  All other systems reviewed and are negative.  Past Medical History:  Diagnosis Date  . Heart murmur   . Seizures ([place]) 40 years ago    none since then per pt  . Sleep apnea    CPAP     Past Surgical History:  Procedure Laterality Date  . COLONOSCOPY  07/20/2005   Dr.Kaplan=normal exam   . HERNIA REPAIR    . KNEE SURGERY Left    6-7 years ago     Current Outpatient Medications:  .  Ciclopirox 0.77 % gel, , Disp: , Rfl:  .  meloxicam (MOBIC) 15 MG tablet, Take 1 tablet (15 mg total) by mouth daily., Disp: 30 tablet, Rfl: 0  No Known Allergies       Objective:  Physical Exam  General: AAO x3, NAD  Dermatological: Skin is warm, dry and supple bilateral. Nails x 10 are well manicured; remaining integument appears unremarkable at this time. There are no open sores, no preulcerative lesions, no rash or signs of infection present.  Vascular: Dorsalis Pedis artery and Posterior Tibial artery pedal pulses are 2/4 bilateral with immedate capillary fill time. Pedal hair growth present. No varicosities and no lower extremity edema present bilateral. There is no pain with calf compression, swelling, warmth, erythema.   Neruologic: Grossly intact via light touch bilateral.  Protective threshold with Semmes Wienstein monofilament intact to all pedal sites bilateral.  Negative Tinel sign.  Musculoskeletal: Significant flatfoot deformity is present.  There is tenderness palpation  although minimal on the plantar aspect of the calcaneus insertion plantar fascia.  No pain on the arch the plantar fascia but this is where he gets discomfort as well.  There is no area of tenderness.  Ankle, subtalar range of motion intact.  MMT 5/5.  Gait: Unassisted, Nonantalgic.       Assessment:   Bilateral heel pain, plantar fasciitis with flatfoot deformity     Plan:  -Treatment options discussed including all alternatives, risks, and complications -Etiology of symptoms were discussed -X-rays were obtained and reviewed with the patient.  Flatfoot deformities present.  No evidence of acute fracture. -Prescribed mobic. Discussed side effects of the medication and directed to stop if any are to occur and call the office.  -Wanted to hold off on a steroid injection today. -Discussed stretching, icing daily. -Bilateral plantar fascial braces dispensed -He was measured today for orthotics.  We will check insurance coverage. -Discussed shoe modifications.  Trula Slade DPM

## 2019-02-10 ENCOUNTER — Telehealth: Payer: Self-pay | Admitting: Podiatry

## 2019-02-10 ENCOUNTER — Other Ambulatory Visit: Payer: Self-pay | Admitting: Podiatry

## 2019-02-10 DIAGNOSIS — M2142 Flat foot [pes planus] (acquired), left foot: Secondary | ICD-10-CM

## 2019-02-10 DIAGNOSIS — M2141 Flat foot [pes planus] (acquired), right foot: Secondary | ICD-10-CM

## 2019-02-10 NOTE — Telephone Encounter (Signed)
Called pt with orthotic benefits and he wants to proceed.   I called [facility] and talked to [facility] and told her to proceed with making the orthotics.

## 2019-03-02 ENCOUNTER — Ambulatory Visit: Payer: BC Managed Care – PPO | Admitting: Orthotics

## 2019-03-02 ENCOUNTER — Other Ambulatory Visit: Payer: Self-pay

## 2019-03-02 DIAGNOSIS — M2142 Flat foot [pes planus] (acquired), left foot: Secondary | ICD-10-CM

## 2019-03-02 DIAGNOSIS — M722 Plantar fascial fibromatosis: Secondary | ICD-10-CM

## 2019-03-02 DIAGNOSIS — M2141 Flat foot [pes planus] (acquired), right foot: Secondary | ICD-10-CM

## 2019-03-02 NOTE — Progress Notes (Signed)
Picked up f/o but wasn't sure if arch hit him right (felt apex should be more distal); also felt he was rolling out laterally on right.  I added valgus ff post, but he wasn't convince.  Made him a follow up appointment in two weeks to adjust.

## 2019-03-16 ENCOUNTER — Other Ambulatory Visit: Payer: Self-pay

## 2019-03-16 ENCOUNTER — Ambulatory Visit: Payer: BC Managed Care – PPO | Admitting: Orthotics

## 2019-03-16 DIAGNOSIS — M2142 Flat foot [pes planus] (acquired), left foot: Secondary | ICD-10-CM

## 2019-03-16 DIAGNOSIS — M2141 Flat foot [pes planus] (acquired), right foot: Secondary | ICD-10-CM

## 2019-03-16 DIAGNOSIS — M722 Plantar fascial fibromatosis: Secondary | ICD-10-CM

## 2019-03-16 NOTE — Progress Notes (Signed)
Add fascial groove, change top cover to spenco

## 2019-03-17 ENCOUNTER — Ambulatory Visit: Payer: BC Managed Care – PPO | Admitting: Cardiology

## 2019-04-06 ENCOUNTER — Encounter: Payer: Self-pay | Admitting: Cardiology

## 2019-06-17 ENCOUNTER — Ambulatory Visit: Payer: BC Managed Care – PPO | Admitting: Acute Care

## 2019-06-17 ENCOUNTER — Ambulatory Visit: Payer: BC Managed Care – PPO | Admitting: Pulmonary Disease

## 2019-06-17 NOTE — Progress Notes (Deleted)
History of Present Illness Alexander George is a 64 y.o. male with OSA on CPAP. Last seen by the office 2012. Followed by Alexander George   06/17/2019 Follow up for CPAP. Last seen 2012.64 YO /M, for evaluation of obstructive sleep apnea . He was diagnosed with sleep apnea in 2003.  His RDI was 21 with a REM RDI of 57.  His oxygen nadir was 76%.  He has been on CPAP since then, set pressure of 8 cm H2O.Marland Kitchen  He uses nasal pillows and has a humidifer. He has a rotating shift with his work, and as a result sleeps at different times of the day.  He gets about 6 to 7 hours of sleep per day.  Pt. Presents for follow up.    Test Results: 07/2010 Down Load>> Download shows AHI of 15/ h on pr of 6 cm, poor usage. 08/24/2010.>>Down Load: Compliance poor, auto CPAP download shows >> mean pr 10 cm.  CBC Latest Ref Rng & Units 02/02/2019 01/22/2018 08/12/2017  WBC 3.4 - 10.8 x10E3/uL 5.5 3.8 4.7  Hemoglobin 13.0 - 17.7 g/dL 14.2 14.2 14.3  Hematocrit 37.5 - 51.0 % 42.6 41.8 42.0(A)  Platelets 150 - 450 x10E3/uL 190 187 -    BMP Latest Ref Rng & Units 02/02/2019 01/22/2018 08/12/2017  Glucose 65 - 99 mg/dL 87 89 120(H)  BUN 8 - 27 mg/dL 21 14 18   Creatinine 0.76 - 1.27 mg/dL 0.90 0.91 0.89  BUN/Creat Ratio 10 - 24 23 15  -  Sodium 134 - 144 mmol/L 141 142 140  Potassium 3.5 - 5.2 mmol/L 4.4 3.8 3.5  Chloride 96 - 106 mmol/L 103 104 101  CO2 20 - 29 mmol/L 25 24 23   Calcium 8.6 - 10.2 mg/dL 9.2 9.0 9.1    BNP No results found for: BNP  ProBNP No results found for: PROBNP  PFT No results found for: FEV1PRE, FEV1POST, FVCPRE, FVCPOST, TLC, DLCOUNC, PREFEV1FVCRT, PSTFEV1FVCRT  No results found.   Past medical hx Past Medical History:  Diagnosis Date  . Heart murmur   . Seizures ([place]) 40 years ago    none since then per pt  . Sleep apnea    CPAP      Social History   Tobacco Use  . Smoking status: Never Smoker  . Smokeless tobacco: Never Used  Substance Use Topics  . Alcohol use: No  .  Drug use: No    Alexander George reports that he has never smoked. He has never used smokeless tobacco. He reports that he does not drink alcohol or use drugs.  Tobacco Cessation: Counseling given: Not Answered   Past surgical hx, Family hx, Social hx all reviewed.  Current Outpatient Medications on File Prior to Visit  Medication Sig  . Ciclopirox 0.77 % gel   . meloxicam (MOBIC) 15 MG tablet Take 1 tablet (15 mg total) by mouth daily.   No current facility-administered medications on file prior to visit.     No Known Allergies  Review Of Systems:  Constitutional:   No  weight loss, night sweats,  Fevers, chills, fatigue, or  lassitude.  HEENT:   No headaches,  Difficulty swallowing,  Tooth/dental problems, or  Sore throat,                No sneezing, itching, ear ache, nasal congestion, post nasal drip,   CV:  No chest pain,  Orthopnea, PND, swelling in lower extremities, anasarca, dizziness, palpitations, syncope.   GI  No heartburn, indigestion, abdominal  pain, nausea, vomiting, diarrhea, change in bowel habits, loss of appetite, bloody stools.   Resp: No shortness of breath with exertion or at rest.  No excess mucus, no productive cough,  No non-productive cough,  No coughing up of blood.  No change in color of mucus.  No wheezing.  No chest wall deformity  Skin: no rash or lesions.  GU: no dysuria, change in color of urine, no urgency or frequency.  No flank pain, no hematuria   MS:  No joint pain or swelling.  No decreased range of motion.  No back pain.  Psych:  No change in mood or affect. No depression or anxiety.  No memory loss.   Vital Signs There were no vitals taken for this visit.   Physical Exam:  General- No distress,  A&Ox3 ENT: No sinus tenderness, TM clear, pale nasal mucosa, no oral exudate,no post nasal drip, no LAN Cardiac: S1, S2, regular rate and rhythm, no murmur Chest: No wheeze/ rales/ dullness; no accessory muscle use, no nasal flaring, no  sternal retractions Abd.: Soft Non-tender Ext: No clubbing cyanosis, edema Neuro:  normal strength Skin: No rashes, warm and dry Psych: normal mood and behavior   Assessment/Plan  No problem-specific Assessment & Plan notes found for this encounter.    Magdalen Spatz, NP 06/17/2019  1:34 PM

## 2019-06-26 ENCOUNTER — Other Ambulatory Visit: Payer: Self-pay

## 2019-06-26 ENCOUNTER — Encounter: Payer: Self-pay | Admitting: Pulmonary Disease

## 2019-06-26 ENCOUNTER — Ambulatory Visit (INDEPENDENT_AMBULATORY_CARE_PROVIDER_SITE_OTHER): Payer: BC Managed Care – PPO | Admitting: Pulmonary Disease

## 2019-06-26 VITALS — BP 110/66 | HR 80 | Temp 97.1°F | Ht 66.0 in | Wt 173.0 lb

## 2019-06-26 DIAGNOSIS — G4733 Obstructive sleep apnea (adult) (pediatric): Secondary | ICD-10-CM

## 2019-06-26 NOTE — Progress Notes (Signed)
Subjective:    Patient ID: Alexander George, male    DOB: [birth year], 64 y.o.   MRN: [id number]  HPI  Chief Complaint  Patient presents with  . Follow-up    Patient is here because he needs new CPAP supplies    64 year old man presents to reestablish care for OSA. He is a retired Electrical engineer He was diagnosed in 2003 with moderate OSA when he presented with loud snoring noted by his wife and gasping episodes that would wake him up from sleep.  His last evaluation by me was in 12/2010 and initially we got him somewhat comfortable on CPAP 6 to 8 cm with nasal pillows however he kept having issues and we was referred to Dr. Ron Parker for dental appliance.  Unfortunately with a dental appliance he had a lot of saliva production and just could not tolerate this and he went back to using CPAP intermittently with nasal pillows. He has been lost to follow-up since.  It seems that he quit using his CPAP for a while and then more recently his wife noted loud snoring again and gasping episodes would come back and so he started using his CPAP again.  However he had nasal issues and he saw ENT who gave him an Atrovent spray this did not help him much.  He continues to remain sleepy around 6 PM. Epworth sleepiness score is 10.  He is now retired.  Bedtime is around 1 am, sleep latency is minimal, he sleeps on his back with 2 pillows, reports 3-4 nocturnal awakenings including nocturia and is out of bed latest by 9 AM feeling tired with dryness of mouth denies headache There is no history suggestive of cataplexy, sleep paralysis or parasomnias   He would like to try and get comfortable on CPAP again   Significant tests/ events reviewed  06/2001 NPSG   RDI was 21 with a REM RDI of 57.  His oxygen nadir was 76%.    Past Medical History:  Diagnosis Date  . Heart murmur   . Seizures ([place]) 40 years ago    none since then per pt  . Sleep apnea    CPAP      Past Surgical History:  Procedure  Laterality Date  . COLONOSCOPY  07/20/2005   Dr.Kaplan=normal exam   . HERNIA REPAIR    . KNEE SURGERY Left    6-7 years ago    No Known Allergies   Social History   Socioeconomic History  . Marital status: Married    Spouse name: Not on file  . Number of children: Not on file  . Years of education: Not on file  . Highest education level: Not on file  Occupational History  . Not on file  Tobacco Use  . Smoking status: Never Smoker  . Smokeless tobacco: Never Used  Substance and Sexual Activity  . Alcohol use: No  . Drug use: No  . Sexual activity: Not on file  Other Topics Concern  . Not on file  Social History Narrative  . Not on file   Social Determinants of Health   Financial Resource Strain:   . Difficulty of Paying Living Expenses: Not on file  Food Insecurity:   . Worried About Charity fundraiser in the Last Year: Not on file  . Ran Out of Food in the Last Year: Not on file  Transportation Needs:   . Lack of Transportation (Medical): Not on file  . Lack of Transportation (Non-Medical): Not  on file  Physical Activity:   . Days of Exercise per Week: Not on file  . Minutes of Exercise per Session: Not on file  Stress:   . Feeling of Stress : Not on file  Social Connections:   . Frequency of Communication with Friends and Family: Not on file  . Frequency of Social Gatherings with Friends and Family: Not on file  . Attends Religious Services: Not on file  . Active Member of Clubs or Organizations: Not on file  . Attends Archivist Meetings: Not on file  . Marital Status: Not on file  Intimate Partner Violence:   . Fear of Current or Ex-Partner: Not on file  . Emotionally Abused: Not on file  . Physically Abused: Not on file  . Sexually Abused: Not on file     Family History  Problem Relation Age of Onset  . Diabetes Father   . Cancer Sister        stomach  . Stomach cancer Sister   . Colon cancer Neg Hx   . Esophageal cancer Neg Hx   .  Rectal cancer Neg Hx      Review of Systems Constitutional: negative for anorexia, fevers and sweats  Eyes: negative for irritation, redness and visual disturbance  Ears, nose, mouth, throat, and face: negative for earaches, epistaxis, nasal congestion and sore throat  Respiratory: negative for cough, dyspnea on exertion, sputum and wheezing  Cardiovascular: negative for chest pain, dyspnea, lower extremity edema, orthopnea, palpitations and syncope  Gastrointestinal: negative for abdominal pain, constipation, diarrhea, melena, nausea and vomiting  Genitourinary:negative for dysuria, frequency and hematuria  Hematologic/lymphatic: negative for bleeding, easy bruising and lymphadenopathy  Musculoskeletal:negative for arthralgias, muscle weakness and stiff joints  Neurological: negative for coordination problems, gait problems, headaches and weakness  Endocrine: negative for diabetic symptoms including polydipsia, polyuria and weight loss     Objective:   Physical Exam  Gen. Pleasant, well-nourished thin man,, in no distress, normal affect ENT - no pallor,icterus, no post nasal drip Neck: No JVD, no thyromegaly, no carotid bruits Lungs: no use of accessory muscles, no dullness to percussion, clear without rales or rhonchi  Cardiovascular: Rhythm regular, heart sounds  normal, no murmurs or gallops, no peripheral edema Abdomen: soft and non-tender, no hepatosplenomegaly, BS normal. Musculoskeletal: No deformities, no cyanosis or clubbing Neuro:  alert, non focal        Assessment & Plan:

## 2019-06-26 NOTE — Assessment & Plan Note (Addendum)
revisit your sleep apnea issue by doing a home sleep test Based on this, we will provide you with a new CPAP machine I obtained and reviewed his CPAP download which shows that his machine is set at 4 cm and has a residual AHI of 6.9 cm, compliance was sporadic, he does agree that he can have better compliance  Meantime, prescription will be sent for new nasal pillows AirFit P 10  Unfortunately he has failed oral appliance in the past.  We discussed inspire device-implant for sleep apnea and he may be a good candidate if he fails CPAP in the future.  We discussed pros and cons and he will do his research  Cardiovascular implications of his degree of OSA was discussed  compliance with goal of at least 4-6 hrs every night is the expectation. Advised against medications with sedative side effects Cautioned against driving when sleepy - understanding that sleepiness will vary on a day to day basis

## 2019-06-26 NOTE — Patient Instructions (Addendum)
  We will revisit your sleep apnea issue by doing a home sleep test Based on this, we will provide you with a new CPAP machine  Meantime, prescription will be sent for new nasal pillows AirFit P 10  Look up inspire device-implant for sleep apnea

## 2019-07-08 ENCOUNTER — Telehealth: Payer: Self-pay | Admitting: *Deleted

## 2019-07-08 NOTE — Telephone Encounter (Signed)
Per PCCs, HSTs are about 6 weeks out so pt may not hear anything to schedule the test until late Feb/early March.    ATC pt to discuss and to inquire about the PND but was informed that 'call could not be completed as dialed' - only 1 number listed in pt's chart and this was tried twice.

## 2019-07-09 NOTE — Telephone Encounter (Signed)
ATC the pt, call can not be completed at this time

## 2019-07-10 NOTE — Telephone Encounter (Signed)
Spoke with Patient in office lobby.  Patient was concerned he missed a call for HST.  Explained PCC's will contact him to schedule his HST. Informed Patient, he should expect a call next month to schedule HST.  Patient stated understanding.  Nothing further at this time.

## 2019-07-10 NOTE — Telephone Encounter (Signed)
Pt here in lobby to speak w/ nurse aa/b his questions was concerned b/c he had not heard from anyone can someone speak to him know.Alexander George

## 2019-07-10 NOTE — Telephone Encounter (Signed)
Attempted to call pt but line rang and rang and then went to a busy signal. Unable to speak to pt and unable to leave a VM.

## 2019-07-12 NOTE — Progress Notes (Signed)
Cardiology Office Note:    Date:  07/13/2019   ID:  Alexander George, DOB [birth year], MRN [id number]  PCP:  Patient, No Pcp Per  Cardiologist:  No primary care provider on file.  Electrophysiologist:  None   Referring MD: Horald Pollen, *   Chief Complaint  Patient presents with  . Chest Pain    History of Present Illness:    Alexander George is a 64 y.o. male with a hx of seizures, OSA who is referred by Dr.Sagardia for an evaluation of chest pain.  Started having chest pain 10 years ago, has been chronic issues.  However, for last 2-3 weeks, has occurred every 2-3 days.  Reports left-sided pain, describes as pinching.  Lasts for about 1 minute and resolves.  Can occur while resting or when walking.  He reports that he walks 20 to 30 minutes/day and has not noted relationship with chest pain and exertion.  Denies any dyspnea,  lightheadedness, syncope, or LE edema.  Reports palpitations occur rarely (every 4-5 months).  Feels like heart is racing,  lasts few seconds and resolves.  Never smoked.  No heart disease in immediate family.      Past Medical History:  Diagnosis Date  . Heart murmur   . Seizures ([place]) 40 years ago    none since then per pt  . Sleep apnea    CPAP     Past Surgical History:  Procedure Laterality Date  . COLONOSCOPY  07/20/2005   Dr.Kaplan=normal exam   . HERNIA REPAIR    . KNEE SURGERY Left    6-7 years ago    Current Medications: No outpatient medications have been marked as taking for the 07/13/19 encounter (Office Visit) with Donato Heinz, MD.     Allergies:   Patient has no known allergies.   Social History   Socioeconomic History  . Marital status: Married    Spouse name: Not on file  . Number of children: Not on file  . Years of education: Not on file  . Highest education level: Not on file  Occupational History  . Not on file  Tobacco Use  . Smoking status: Never Smoker  . Smokeless tobacco: Never Used  Substance  and Sexual Activity  . Alcohol use: No  . Drug use: No  . Sexual activity: Not on file  Other Topics Concern  . Not on file  Social History Narrative  . Not on file   Social Determinants of Health   Financial Resource Strain:   . Difficulty of Paying Living Expenses: Not on file  Food Insecurity:   . Worried About Charity fundraiser in the Last Year: Not on file  . Ran Out of Food in the Last Year: Not on file  Transportation Needs:   . Lack of Transportation (Medical): Not on file  . Lack of Transportation (Non-Medical): Not on file  Physical Activity:   . Days of Exercise per Week: Not on file  . Minutes of Exercise per Session: Not on file  Stress:   . Feeling of Stress : Not on file  Social Connections:   . Frequency of Communication with Friends and Family: Not on file  . Frequency of Social Gatherings with Friends and Family: Not on file  . Attends Religious Services: Not on file  . Active Member of Clubs or Organizations: Not on file  . Attends Archivist Meetings: Not on file  . Marital Status: Not on file  Family History: The patient's family history includes Cancer in his sister; Diabetes in his father; Stomach cancer in his sister. There is no history of Colon cancer, Esophageal cancer, or Rectal cancer.  ROS:   Please see the history of present illness.     All other systems reviewed and are negative.  EKGs/Labs/Other Studies Reviewed:    The following studies were reviewed today:   EKG:  EKG is  ordered today.  The ekg ordered today demonstrates normal sinus rhythm, rate 67, no ST/T abnormalities  Recent Labs: 02/02/2019: ALT 11; BUN 21; Creatinine, Ser 0.90; Hemoglobin 14.2; Platelets 190; Potassium 4.4; Sodium 141  Recent Lipid Panel    Component Value Date/Time   CHOL 137 01/22/2018 0943   TRIG 65 01/22/2018 0943   HDL 66 01/22/2018 0943   CHOLHDL 2.1 01/22/2018 0943   CHOLHDL 2.5 04/27/2015 1354   VLDL 18 04/27/2015 1354    LDLCALC 58 01/22/2018 0943    Physical Exam:    VS:  BP 115/70   Pulse 65   Temp (!) 97.3 F (36.3 C)   Ht 5\' 6"  (1.676 m)   Wt 174 lb (78.9 kg)   SpO2 98%   BMI 28.08 kg/m     Wt Readings from Last 3 Encounters:  07/13/19 174 lb (78.9 kg)  06/26/19 173 lb (78.5 kg)  02/02/19 166 lb 6.4 oz (75.5 kg)     GEN:  Well nourished, well developed in no acute distress HEENT: Normal NECK: No JVD LYMPHATICS: No lymphadenopathy CARDIAC: RRR, no murmurs RESPIRATORY:  Clear to auscultation without rales, wheezing or rhonchi  ABDOMEN: Soft, non-tender, non-distended MUSCULOSKELETAL:  No edema; No deformity  SKIN: Warm and dry NEUROLOGIC:  Alert and oriented x 3 PSYCHIATRIC:  Normal affect   ASSESSMENT:    1. Chest pain, unspecified type   2. Palpitations    PLAN:    In order of problems listed above:  Chest pain: Atypical in description.  However, considering his age he is at risk for CAD.  Will evaluate with coronary CTA  Palpitations: Occurring every 4 to 5 months, brief episodes where feels like heart is racing.  Will plan to check cardiac monitor if occurring more frequently or causing any distress.  OSA: On CPAP  RTC in 2 months   Medication Adjustments/Labs and Tests Ordered: Current medicines are reviewed at length with the patient today.  Concerns regarding medicines are outlined above.  Orders Placed This Encounter  Procedures  . CT CORONARY MORPH W/CTA COR W/SCORE W/CA W/CM &/OR WO/CM  . CT CORONARY FRACTIONAL FLOW RESERVE DATA PREP  . CT CORONARY FRACTIONAL FLOW RESERVE FLUID ANALYSIS  . Basic metabolic panel  . EKG 12-Lead   Meds ordered this encounter  Medications  . metoprolol tartrate (LOPRESSOR) 50 MG tablet    Sig: Take 50 mg (1 tablet) TWO hours prior to CT    Dispense:  1 tablet    Refill:  0    Patient Instructions  Medication Instructions:  Your physician recommends that you continue on your current medications as directed. Please refer to  the Current Medication list given to you today.  *If you need a refill on your cardiac medications before your next appointment, please call your pharmacy*  Lab Work: Your physician recommends that you return for lab work in: 1 week prior to CT scan  If you have labs (blood work) drawn today and your tests are completely normal, you will receive your results only by: Marland Kitchen MyChart  Message (if you have MyChart) OR . A paper copy in the mail If you have any lab test that is abnormal or we need to change your treatment, we will call you to review the results.  Testing/Procedures: Your physician has requested that you have cardiac CT. Cardiac computed tomography (CT) is a painless test that uses an x-ray machine to take clear, detailed pictures of your heart. For further information please visit [email or web address][email or web address]. Please follow instruction sheet as given.  Follow-Up: At [facility], you and your health needs are our priority.  As part of our continuing mission to provide you with exceptional heart care, we have created designated Provider Care Teams.  These Care Teams include your primary Cardiologist (physician) and Advanced Practice Providers (APPs -  Physician Assistants and Nurse Practitioners) who all work together to provide you with the care you need, when you need it.  Your next appointment:   2 month(s)  The format for your next appointment:   In Person  Provider:   Oswaldo Milian, MD  Other Instructions Your cardiac CT will be scheduled at one of the below locations:   [facility] [address] [place], [place] [address] [phone]  [place] [address] [place], [place] [address] [phone]  If scheduled at [facility], please arrive at the [facility] main entrance of [facility] 30-45 minutes prior to test start time. Proceed to the [facility] Radiology Department (first  floor) to check-in and test prep.  If scheduled at [facility], please arrive 15 mins early for check-in and test prep.  Please follow these instructions carefully (unless otherwise directed):  On the Night Before the Test: . Be sure to Drink plenty of water. . Do not consume any caffeinated/decaffeinated beverages or chocolate 12 hours prior to your test. . Do not take any antihistamines 12 hours prior to your test.  On the Day of the Test: . Drink plenty of water. Do not drink any water within one hour of the test. . Do not eat any food 4 hours prior to the test. . You may take your regular medications prior to the test.  . Take metoprolol (Lopressor) two hours prior to test.       After the Test: . Drink plenty of water. . After receiving IV contrast, you may experience a mild flushed feeling. This is normal. . On occasion, you may experience a mild rash up to 24 hours after the test. This is not dangerous. If this occurs, you can take Benadryl 25 mg and increase your fluid intake. . If you experience trouble breathing, this can be serious. If it is severe call 911 IMMEDIATELY. If it is mild, please call our office. . If you take any of these medications: Glipizide/Metformin, Avandament, Glucavance, please do not take 48 hours after completing test unless otherwise instructed.   Once we have confirmed authorization from your insurance company, we will call you to set up a date and time for your test.   For non-scheduling related questions, please contact the cardiac imaging nurse navigator should you have any questions/concerns: Marchia Bond, RN Navigator Cardiac Imaging [facility] Heart and Vascular Services [phone] Office        Signed, Donato Heinz, MD  07/13/2019 6:27 PM    [place]

## 2019-07-13 ENCOUNTER — Other Ambulatory Visit: Payer: Self-pay

## 2019-07-13 ENCOUNTER — Encounter: Payer: Self-pay | Admitting: Cardiology

## 2019-07-13 ENCOUNTER — Ambulatory Visit: Payer: BC Managed Care – PPO | Admitting: Cardiology

## 2019-07-13 VITALS — BP 115/70 | HR 65 | Temp 97.3°F | Ht 66.0 in | Wt 174.0 lb

## 2019-07-13 DIAGNOSIS — R002 Palpitations: Secondary | ICD-10-CM | POA: Diagnosis not present

## 2019-07-13 DIAGNOSIS — R079 Chest pain, unspecified: Secondary | ICD-10-CM

## 2019-07-13 MED ORDER — METOPROLOL TARTRATE 50 MG PO TABS: ORAL_TABLET | ORAL | 0 refills | Status: DC

## 2019-07-13 NOTE — Patient Instructions (Signed)
Medication Instructions:  Your physician recommends that you continue on your current medications as directed. Please refer to the Current Medication list given to you today.  *If you need a refill on your cardiac medications before your next appointment, please call your pharmacy*  Lab Work: Your physician recommends that you return for lab work in: 1 week prior to CT scan  If you have labs (blood work) drawn today and your tests are completely normal, you will receive your results only by: Marland Kitchen MyChart Message (if you have MyChart) OR . A paper copy in the mail If you have any lab test that is abnormal or we need to change your treatment, we will call you to review the results.  Testing/Procedures: Your physician has requested that you have cardiac CT. Cardiac computed tomography (CT) is a painless test that uses an x-ray machine to take clear, detailed pictures of your heart. For further information please visit [email or web address].tn. Please follow instruction sheet as given.  Follow-Up: At [facility], you and your health needs are our priority.  As part of our continuing mission to provide you with exceptional heart care, we have created designated Provider Care Teams.  These Care Teams include your primary Cardiologist (physician) and Advanced Practice Providers (APPs -  Physician Assistants and Nurse Practitioners) who all work together to provide you with the care you need, when you need it.  Your next appointment:   2 month(s)  The format for your next appointment:   In Person  Provider:   Oswaldo Milian, MD  Other Instructions Your cardiac CT will be scheduled at one of the below locations:   [facility] [address] [place], [place] [address] [phone]  [place] [address] [place], [place] [address] [phone]  If scheduled at [facility], please arrive at the [facility]  main entrance of [facility] 30-45 minutes prior to test start time. Proceed to the [facility] Radiology Department (first floor) to check-in and test prep.  If scheduled at [facility], please arrive 15 mins early for check-in and test prep.  Please follow these instructions carefully (unless otherwise directed):  On the Night Before the Test: . Be sure to Drink plenty of water. . Do not consume any caffeinated/decaffeinated beverages or chocolate 12 hours prior to your test. . Do not take any antihistamines 12 hours prior to your test.  On the Day of the Test: . Drink plenty of water. Do not drink any water within one hour of the test. . Do not eat any food 4 hours prior to the test. . You may take your regular medications prior to the test.  . Take metoprolol (Lopressor) two hours prior to test.       After the Test: . Drink plenty of water. . After receiving IV contrast, you may experience a mild flushed feeling. This is normal. . On occasion, you may experience a mild rash up to 24 hours after the test. This is not dangerous. If this occurs, you can take Benadryl 25 mg and increase your fluid intake. . If you experience trouble breathing, this can be serious. If it is severe call 911 IMMEDIATELY. If it is mild, please call our office. . If you take any of these medications: Glipizide/Metformin, Avandament, Glucavance, please do not take 48 hours after completing test unless otherwise instructed.   Once we have confirmed authorization from [employer],  we will call you to set up a date and time for your test.   For non-scheduling related questions, please contact the cardiac imaging nurse navigator should you have any questions/concerns: Marchia Bond, RN Navigator Cardiac Imaging [place] and Vascular Services [phone] Office

## 2019-07-16 ENCOUNTER — Telehealth: Payer: Self-pay | Admitting: Pulmonary Disease

## 2019-07-16 NOTE — Telephone Encounter (Signed)
Looked to see if we had received [employer] readings in RA's folder. Did not see them.   Left message for patient to call back.

## 2019-07-17 NOTE — Telephone Encounter (Signed)
I called pt and the phone didn't ring and stated that the call couldn't be completed at this time. Will try again later.

## 2019-07-20 NOTE — Telephone Encounter (Signed)
Pt is here in the foyer now can someone come and speak with him about cpap supplies

## 2019-07-20 NOTE — Telephone Encounter (Signed)
LMTCB x2 for pt 

## 2019-07-20 NOTE — Telephone Encounter (Signed)
Spoke with the pt  He states never received a call from our office  I apologized and let him know that we did try to call him today at 8:37 am  He states that we should have received a DL from [employer] for Dr Elsworth Soho to review, and that given this information, he is questioning if he still needs to do a sleep study  I checked in [place] but he is not enrolled  Called [employer] and requested that they fax DL since I was unable to locate  Then will send to [facility] for him to review DL and advise on whether he still needs sleep study

## 2019-07-21 NOTE — Telephone Encounter (Signed)
Still have not received the DL from [employer]  I called and spoke with Magda Paganini from [employer] and she states that although she can see that the DL is uploaded, she can not print and share this with [place]  She states that the pt would need to bring his chip here for [place] to get a DL   I called the pt and call would not go through- msg states call can not be completed at this time

## 2019-07-22 NOTE — Telephone Encounter (Signed)
No faxed information has been received for this patient as of time stamp. Not able to access on resmed airview or care orchestrator.

## 2019-07-22 NOTE — Telephone Encounter (Signed)
Has this download been received?

## 2019-07-22 NOTE — Telephone Encounter (Signed)
No faxed download information received as of this time stamp.

## 2019-07-23 NOTE — Telephone Encounter (Signed)
Checked Airview and there is still nothing in there on pt. Checked Dr. Bari Mantis papers and still no download int here either.  Called [employer] and spoke with [facility] to see if we could get pt added in [place] and after she got to looking at it, it seems like [employer] only supplies pt with the supplies for his machine and does not do anything in regards to the actual PAP machine itself so she is unable to get pt added in [place].  Attempted to call pt to further discuss this with him but I immediately received a message that the call could not be completed at this time to try again later.

## 2019-07-24 NOTE — Telephone Encounter (Signed)
I tried to call pt but there was a message stating the call couldn't be completed at this time. Will try again at another time.

## 2019-07-27 NOTE — Telephone Encounter (Signed)
Attempted to contact pt to discuss this matter. I immediately received a message stating that my call could not be completed. We have attempted to contact pt several times with no success or call back from pt. Per triage protocol, message will be closed.

## 2019-08-03 ENCOUNTER — Telehealth: Payer: Self-pay | Admitting: Pulmonary Disease

## 2019-08-03 NOTE — Telephone Encounter (Signed)
ATC pt x 2, automated message stated 'your call could not be completed as dialed'. WCB.

## 2019-08-04 NOTE — Telephone Encounter (Signed)
ATC pt. No option for VM. There is no other number listed in pts chart to try and reach pt. Will attempt a couple more times before closing message.

## 2019-08-05 NOTE — Telephone Encounter (Signed)
ATC. NO VM option available. Will try once more before closing message.

## 2019-08-06 ENCOUNTER — Telehealth: Payer: Self-pay | Admitting: Pulmonary Disease

## 2019-08-06 DIAGNOSIS — G4733 Obstructive sleep apnea (adult) (pediatric): Secondary | ICD-10-CM

## 2019-08-06 NOTE — Telephone Encounter (Signed)
I called pt & left him a vm letting him know we are working on precert and asked him about cpap supply order.  Order was sent to [facility] in January and I asked him if he wants me to go ahead and send order to [facility] to see if they will provide supplies based off of old sleep study or if he received the supplies already from [employer].

## 2019-08-06 NOTE — Telephone Encounter (Signed)
New order placed for CPAP supplies to go to [facility]. Nothing further needed at this time.

## 2019-08-06 NOTE — Telephone Encounter (Signed)
Rhonda sent me a message that she verified no PA required for hst.  PT called & left me vm to go ahead and send order to [facility] for supplies.  I called pt & left him a vm to let him know I will send the order to [facility] for him and someone will be calling him for hst.  Order that was sent to [employer] in January had their name on it.  Will route back to triage to have new order placed for cpap supplies to be sent to [facility].

## 2019-08-06 NOTE — Telephone Encounter (Signed)
Spoke with pt. Pt is requesting to change from [employer] to [facility]. Pt states he wants to change DMEs because [employer] is asking for his credit card when insurance is paying 100%. Pt called [facility] and confirmed they would not ask or need his credit card if insurance is paying 100%. Pt put a new phone number on file for [place] to reach him. Pt also questioning when he will be able to do his HST, order placed on 1.15.21. Because pt's number hasnt been working that is likely the cause of him not being contacted to schedule HST.   Dr. Elsworth Soho, please advise on DME switch order.   PCC's please advise on pt's HST. Thanks.

## 2019-08-06 NOTE — Telephone Encounter (Signed)
ATC pt. No VM option. Will sign off on encounter as we have attempted to reach pt multiple times without success. Will close encounter per triage protocol.

## 2019-08-06 NOTE — Telephone Encounter (Signed)
I will work on this.  Order has been placed for hst and looks like Suanne Marker has completed precert.  I have sent her a teams message to ask about it.

## 2019-08-13 ENCOUNTER — Ambulatory Visit: Payer: BC Managed Care – PPO

## 2019-08-13 ENCOUNTER — Other Ambulatory Visit: Payer: Self-pay

## 2019-08-13 DIAGNOSIS — G4733 Obstructive sleep apnea (adult) (pediatric): Secondary | ICD-10-CM

## 2019-08-17 ENCOUNTER — Telehealth: Payer: Self-pay | Admitting: Pulmonary Disease

## 2019-08-17 DIAGNOSIS — G4733 Obstructive sleep apnea (adult) (pediatric): Secondary | ICD-10-CM

## 2019-08-17 NOTE — Telephone Encounter (Signed)
Home sleep test showed severe OSA 30 times an hour. Ensure prescription for auto CPAP 5 to 10 cm has been sent Office visit in 4 to 6 weeks with APP/me

## 2019-08-19 LAB — BASIC METABOLIC PANEL
BUN/Creatinine Ratio: 17 (ref 10–24)
BUN: 16 mg/dL (ref 8–27)
CO2: 22 mmol/L (ref 20–29)
Calcium: 8.8 mg/dL (ref 8.6–10.2)
Chloride: 102 mmol/L (ref 96–106)
Creatinine, Ser: 0.92 mg/dL (ref 0.76–1.27)
GFR calc Af Amer: 102 mL/min/{1.73_m2} (ref >59)
GFR calc non Af Amer: 88 mL/min/{1.73_m2} (ref >59)
Glucose: 83 mg/dL (ref 65–99)
Potassium: 4.3 mmol/L (ref 3.5–5.2)
Sodium: 139 mmol/L (ref 134–144)

## 2019-08-19 NOTE — Telephone Encounter (Signed)
Unable to reach patient, had busy signal. No voicemail pick up.  Will try again later.

## 2019-08-21 NOTE — Telephone Encounter (Signed)
Unable to reach the patient, no busy signal or voice mail pick up.

## 2019-08-24 ENCOUNTER — Telehealth (HOSPITAL_COMMUNITY): Payer: Self-pay | Admitting: Emergency Medicine

## 2019-08-24 NOTE — Telephone Encounter (Signed)
Reaching out to patient to offer assistance regarding upcoming cardiac imaging study; pt verbalizes understanding of appt date/time, parking situation and where to check in, pre-test NPO status and medications ordered, and verified current allergies; name and call back number provided for further questions should they arise Marchia Bond RN Navigator Cardiac Imaging [place] and Vascular [phone] office [phone] cell   Pt forgot to pick up one time dose metoprolol, appreciated the reminder. I called his pharm to ensure it was still available for pick up today. Clarise Cruz

## 2019-08-24 NOTE — Telephone Encounter (Signed)
Pt called back, reviewed results and recs with him.  Pt states he wishes to call back to schedule an appt after he receives his cpap to make sure he is properly within the 31-90 day window for insurance coverage.  I stressed the importance of this appt and pt expressed understanding.  cpap ordered.  Nothing further needed at this time- will close encounter.

## 2019-08-25 ENCOUNTER — Other Ambulatory Visit: Payer: Self-pay

## 2019-08-25 ENCOUNTER — Telehealth: Payer: Self-pay | Admitting: Pulmonary Disease

## 2019-08-25 ENCOUNTER — Ambulatory Visit (HOSPITAL_COMMUNITY)
Admission: RE | Admit: 2019-08-25 | Discharge: 2019-08-25 | Disposition: A | Discharge status: Home / Self Care | Payer: BC Managed Care – PPO | Source: Ambulatory Visit | Attending: Cardiology | Admitting: Cardiology

## 2019-08-25 ENCOUNTER — Telehealth (HOSPITAL_COMMUNITY): Payer: Self-pay | Admitting: Emergency Medicine

## 2019-08-25 DIAGNOSIS — R079 Chest pain, unspecified: Secondary | ICD-10-CM | POA: Diagnosis present

## 2019-08-25 MED ORDER — IOHEXOL 350 MG/ML SOLN
100.0000 mL | Freq: Once | INTRAVENOUS | Status: AC | PRN
  Administered 2019-08-25: 100 mL via INTRAVENOUS

## 2019-08-25 MED ORDER — NITROGLYCERIN 0.4 MG SL SUBL
SUBLINGUAL_TABLET | SUBLINGUAL | Status: AC
  Filled 2019-08-25: qty 2

## 2019-08-25 MED ORDER — NITROGLYCERIN 0.4 MG SL SUBL
0.8000 mg | SUBLINGUAL_TABLET | Freq: Once | SUBLINGUAL | Status: AC
  Administered 2019-08-25: 0.8 mg via SUBLINGUAL

## 2019-08-25 NOTE — Telephone Encounter (Signed)
Spoke with pt, advised him that I would leave a copy of his sleep study downstairs to pick up. Nothing further is needed.

## 2019-08-25 NOTE — Telephone Encounter (Signed)
Pt calling about metoprolol bottle stating to take pill with food. I encouraged him to eat something small. Nothing big. He said he would take with toast.   appreciated the return phone call.  Marchia Bond RN Navigator Cardiac Imaging [facility] Heart and Vascular Services [phone] Office  [phone] Cell

## 2019-08-26 ENCOUNTER — Other Ambulatory Visit: Payer: Self-pay | Admitting: *Deleted

## 2019-08-26 DIAGNOSIS — I517 Cardiomegaly: Secondary | ICD-10-CM

## 2019-08-28 ENCOUNTER — Telehealth: Payer: Self-pay | Admitting: Pulmonary Disease

## 2019-08-28 NOTE — Telephone Encounter (Signed)
Called and spoke with Patient.  Patient stated he went by [facility] last week and picked up a mask, and they told him that Dr. Elsworth Soho had not sent an order for a cpap machine, just supplies.   Explained cpap order was placed, 08/24/19,by Dr. Elsworth Soho.  Message routed to Kevin, [facility]

## 2019-08-28 NOTE — Telephone Encounter (Signed)
cpap order was placed on 08/24/2019 and notes on order state that [facility] received this order.  [facility] is telling pt that they do not have this order.   PCCs please advise on status of order.  Thanks!

## 2019-08-28 NOTE — Telephone Encounter (Signed)
Sent high priority message to [facility] to determine what is going on with this order that they received (it is clearly documented on the order that this was sent & received by [facility]).  I will update as soon as I get an answer.

## 2019-08-28 NOTE — Telephone Encounter (Signed)
Response from [facility]:  Alexander George sent to Alexander George D  Is this order for a new machine or just for supplies? We saw this pt in our office when he walked in on 08/19/19. He did not bring in his machine and stated he was coming for supplies, so we treated him as a TOC. We provided him a mask - he declined other supplies at that time per the RT that sat with him.

## 2019-08-31 ENCOUNTER — Telehealth: Payer: Self-pay | Admitting: Pulmonary Disease

## 2019-08-31 NOTE — Telephone Encounter (Signed)
It looks as if the order was for a new machine. I will send a message to [facility] to see if patient can get a new machine. Will hold in triage to update.

## 2019-08-31 NOTE — Telephone Encounter (Signed)
Please see response from [facility] placed in the message 3/19.

## 2019-08-31 NOTE — Telephone Encounter (Signed)
Duplicate encounter.  Please see 3/19 phone note.

## 2019-09-02 NOTE — Telephone Encounter (Signed)
I spoke with Janett Billow from [employer] and she states that she will process the request that was received as an order for a new machine and not just supplies. Nothing further is needed.

## 2019-09-02 NOTE — Telephone Encounter (Signed)
Checked Alexander George inbasket and there aren't any messages from [employer]. Danae Chen - have you received a response from [facility]?

## 2019-09-11 ENCOUNTER — Ambulatory Visit (HOSPITAL_COMMUNITY): Payer: BC Managed Care – PPO | Attending: Cardiology

## 2019-09-11 ENCOUNTER — Encounter (INDEPENDENT_AMBULATORY_CARE_PROVIDER_SITE_OTHER): Payer: Self-pay

## 2019-09-11 ENCOUNTER — Other Ambulatory Visit: Payer: Self-pay

## 2019-09-11 DIAGNOSIS — I517 Cardiomegaly: Secondary | ICD-10-CM | POA: Insufficient documentation

## 2019-09-13 NOTE — Progress Notes (Signed)
Cardiology Office Note:    Date:  09/14/2019   ID:  Alexander George, DOB March 22, 1956, MRN [id number]  PCP:  Patient, No Pcp Per  Cardiologist:  Donato Heinz, MD  Electrophysiologist:  None   Referring MD: No ref. provider found   Chief Complaint  Patient presents with  . Chest Pain    History of Present Illness:    Alexander George is a 64 y.o. male with a hx of seizures, OSA who presents for follow-up.  He was referred by Dr.Sagardia for an evaluation of chest pain, initially seen on 07/13/2019.  Started having chest pain 10 years ago, has been chronic issue.  However, for 2-3 weeks prior to initial appointment, has occurred every 2-3 days.  Reports left-sided pain, describes as pinching.  Lasts for about 1 minute and resolves.  Can occur while resting or when walking.  He reports that he walks 20 to 30 minutes/day and has not noted relationship with chest pain and exertion.  Denies any dyspnea,  lightheadedness, syncope, or LE edema.  Reports palpitations occur rarely (every 4-5 months).  Feels like heart is racing,  lasts few seconds and resolves.  Never smoked.  No heart disease in immediate family.    Coronary CTA on 08/25/2019 showed normal coronary arteries but moderate right atrial/right ventricular dilatation.  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  Since last clinic visit, he reports that he is doing well.  He continues to have chest pain, that he describes as pinching pain on the left side of his chest that occurs every 2 to 3 days, lasting for about 1 minute.  Continues to have rare palpitations that occur every 4 months or so where he feels like his heart is racing.  Last episode 2 weeks ago.  He denies any lightheadedness, dyspnea, or syncope.     Past Medical History:    Diagnosis Date  . Heart murmur   . Seizures ([place]) 40 years ago    none since then per pt  . Sleep apnea    CPAP     Past Surgical History:  Procedure Laterality Date  . COLONOSCOPY  07/20/2005   Dr.Kaplan=normal exam   . HERNIA REPAIR    . KNEE SURGERY Left    6-7 years ago    Current Medications: No outpatient medications have been marked as taking for the 09/14/19 encounter (Office Visit) with Donato Heinz, MD.     Allergies:   Patient has no known allergies.   Social History   Socioeconomic History  . Marital status: Married    Spouse name: Not on file  . Number of children: Not on file  . Years of education: Not on file  . Highest education level: Not on file  Occupational History  . Not on file  Tobacco Use  . Smoking status: Never Smoker  . Smokeless tobacco: Never Used  Substance and Sexual Activity  . Alcohol use: No  . Drug use: No  . Sexual activity: Not on file  Other Topics Concern  . Not on file  Social History Narrative  . Not on file   Social Determinants of Health   Financial Resource Strain:   . Difficulty of Paying Living Expenses:   Food Insecurity:   . Worried About Charity fundraiser in the Last Year:   .  Ran Out of Food in the Last Year:   Transportation Needs:   . Film/video editor (Medical):   Marland Kitchen Lack of Transportation (Non-Medical):   Physical Activity:   . Days of Exercise per Week:   . Minutes of Exercise per Session:   Stress:   . Feeling of Stress :   Social Connections:   . Frequency of Communication with Friends and Family:   . Frequency of Social Gatherings with Friends and Family:   . Attends Religious Services:   . Active Member of Clubs or Organizations:   . Attends Archivist Meetings:   Marland Kitchen Marital Status:      Family History: The patient's family history includes Cancer in his sister; Diabetes in his father; Stomach cancer in his sister. There is no history of Colon cancer, Esophageal  cancer, or Rectal cancer.  ROS:   Please see the history of present illness.     All other systems reviewed and are negative.  EKGs/Labs/Other Studies Reviewed:    The following studies were reviewed today:   EKG:  EKG is not ordered today.  The ekg ordered most recently demonstrates normal sinus rhythm, rate 67, no ST/T abnormalities  TTE 09/11/2019: 1. Left ventricular ejection fraction, by estimation, is 55%. The left  ventricle has normal function. The left ventricle has no regional wall  motion abnormalities. There is mild asymmetric left ventricular  hypertrophy of the posterior-lateral segment.  Left ventricular diastolic parameters are consistent with Grade I  diastolic dysfunction (impaired relaxation).  2. Right ventricular systolic function is normal. The right ventricular  size is moderately enlarged. There is normal pulmonary artery systolic  pressure. The estimated right ventricular systolic pressure is 0000000 mmHg.  3. 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  The mitral valve is abnormal. Trivial mitral valve regurgitation. No  evidence of mitral stenosis.  4. Tricuspid valve regurgitation is mild to moderate.  5. The aortic valve is tricuspid. Aortic valve regurgitation is not  visualized. No aortic stenosis is present.  6. There is dilatation of the ascending aorta.  7. The inferior vena cava is normal in size with greater than 50%  respiratory variability, suggesting right atrial pressure of 3 mmHg.  Coronary CTA 08/25/19: 1. Coronary calcium score of 0. 2. No evidence of CAD. 3. Moderate right atrial and right ventricular dilatation  CAD-RADS 0. No evidence of CAD (0%). Consider non-atherosclerotic causes of chest pain.  IMPRESSION: Mild cardiomegaly.  No acute extra cardiac abnormality.   Recent Labs: 02/02/2019: ALT 11; Hemoglobin 14.2; Platelets 190 08/19/2019: BUN 16; Creatinine, Ser 0.92; Potassium 4.3; Sodium 139    Recent Lipid Panel    Component Value Date/Time   CHOL 137 01/22/2018 0943   TRIG 65 01/22/2018 0943   HDL 66 01/22/2018 0943   CHOLHDL 2.1 01/22/2018 0943   CHOLHDL 2.5 04/27/2015 1354   VLDL 18 04/27/2015 1354   LDLCALC 58 01/22/2018 0943    Physical Exam:    VS:  BP 125/74   Pulse 81   Temp (!) 97 F (36.1 C)   Resp (!) 97   Ht 5\' 6"  (1.676 m)   Wt 168 lb (76.2 kg)   BMI 27.12 kg/m     Wt Readings from Last 3 Encounters:  09/14/19 168 lb (76.2 kg)  07/13/19 174 lb (78.9 kg)  06/26/19 173 lb (78.5 kg)     GEN:  Well nourished, well developed in no acute distress HEENT: Normal NECK: No JVD  LYMPHATICS: No lymphadenopathy CARDIAC: RRR, no murmurs RESPIRATORY:  Clear to auscultation without rales, wheezing or rhonchi  ABDOMEN: Soft, non-tender, non-distended MUSCULOSKELETAL:  No edema; No deformity  SKIN: Warm and dry NEUROLOGIC:  Alert and oriented x 3 PSYCHIATRIC:  Normal affect   ASSESSMENT:    1. Right ventricular dilation   2. Mitral valve disorder   3. Chest pain of uncertain etiology   4. Palpitations   5. OSA (obstructive sleep apnea)    PLAN:     Chest pain: Atypical in description.  Coronary CTA shows normal coronary arteries.  Suspect noncardiac chest pain  RV dilatation: Moderate RV dilatation on TTE.  Unclear cause, concerning for shunt.  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  Will evaluate for LGE on CMR  Palpitations: Occurring every 4 to 5 months, brief episodes where feels like heart is racing.  Will plan to check cardiac monitor if occurring more frequently or causing any distress.  OSA: On CPAP.  Encourage compliance  RTC in 3 months   Medication Adjustments/Labs and Tests Ordered: Current medicines are reviewed at length with the patient today.  Concerns regarding medicines are outlined above.  Orders Placed This Encounter  Procedures  . MR CARDIAC  MORPHOLOGY W WO CONTRAST   No orders of the defined types were placed in this encounter.   Patient Instructions  Medication Instructions:  Your physician recommends that you continue on your current medications as directed. Please refer to the Current Medication list given to you today.  *If you need a refill on your cardiac medications before your next appointment, please call your pharmacy*   Lab Work: NONE  Testing/Procedures: Your physician has requested that you have a cardiac MRI. Cardiac MRI uses a computer to create images of your heart as its beating, producing both still and moving pictures of your heart and major blood vessels. For further information please visit [email or web address] Please follow the instruction sheet given to you today for more information. --this must be approved by insurance prior to scheduling   Follow-Up: At [facility], you and your health needs are our priority.  As part of our continuing mission to provide you with exceptional heart care, we have created designated Provider Care Teams.  These Care Teams include your primary Cardiologist (physician) and Advanced Practice Providers (APPs -  Physician Assistants and Nurse Practitioners) who all work together to provide you with the care you need, when you need it.  We recommend signing up for the patient portal called "MyChart".  Sign up information is provided on this After Visit Summary.  MyChart is used to connect with patients for Virtual Visits (Telemedicine).  Patients are able to view lab/test results, encounter notes, upcoming appointments, etc.  Non-urgent messages can be sent to your provider as well.   To learn more about what you can do with MyChart, go to [email or web address].    Your next appointment:   3 month(s)  **Make this appointment when they call to schedule the MRI**  The format for your next appointment:   In Person  Provider:   Oswaldo Milian, MD          Signed, Donato Heinz, MD  09/14/2019 1:58 PM    [place]

## 2019-09-14 ENCOUNTER — Ambulatory Visit (INDEPENDENT_AMBULATORY_CARE_PROVIDER_SITE_OTHER): Payer: BC Managed Care – PPO | Admitting: Cardiology

## 2019-09-14 ENCOUNTER — Encounter: Payer: Self-pay | Admitting: Cardiology

## 2019-09-14 ENCOUNTER — Other Ambulatory Visit: Payer: Self-pay

## 2019-09-14 VITALS — BP 125/74 | HR 81 | Temp 97.0°F | Resp 97 | Ht 66.0 in | Wt 168.0 lb

## 2019-09-14 DIAGNOSIS — I059 Rheumatic mitral valve disease, unspecified: Secondary | ICD-10-CM

## 2019-09-14 DIAGNOSIS — R079 Chest pain, unspecified: Secondary | ICD-10-CM | POA: Diagnosis not present

## 2019-09-14 DIAGNOSIS — I517 Cardiomegaly: Secondary | ICD-10-CM | POA: Diagnosis not present

## 2019-09-14 DIAGNOSIS — R002 Palpitations: Secondary | ICD-10-CM

## 2019-09-14 DIAGNOSIS — G4733 Obstructive sleep apnea (adult) (pediatric): Secondary | ICD-10-CM

## 2019-09-14 NOTE — Patient Instructions (Signed)
Medication Instructions:  Your physician recommends that you continue on your current medications as directed. Please refer to the Current Medication list given to you today.  *If you need a refill on your cardiac medications before your next appointment, please call your pharmacy*   Lab Work: NONE  Testing/Procedures: Your physician has requested that you have a cardiac MRI. Cardiac MRI uses a computer to create images of your heart as its beating, producing both still and moving pictures of your heart and major blood vessels. For further information please visit [email or web address] Please follow the instruction sheet given to you today for more information. --this must be approved by insurance prior to scheduling   Follow-Up: At [facility], you and your health needs are our priority.  As part of our continuing mission to provide you with exceptional heart care, we have created designated Provider Care Teams.  These Care Teams include your primary Cardiologist (physician) and Advanced Practice Providers (APPs -  Physician Assistants and Nurse Practitioners) who all work together to provide you with the care you need, when you need it.  We recommend signing up for the patient portal called "MyChart".  Sign up information is provided on this After Visit Summary.  MyChart is used to connect with patients for Virtual Visits (Telemedicine).  Patients are able to view lab/test results, encounter notes, upcoming appointments, etc.  Non-urgent messages can be sent to your provider as well.   To learn more about what you can do with MyChart, go to [email or web address].    Your next appointment:   3 month(s)  **Make this appointment when they call to schedule the MRI**  The format for your next appointment:   In Person  Provider:   Oswaldo Milian, MD

## 2019-09-17 ENCOUNTER — Other Ambulatory Visit: Payer: Self-pay

## 2019-09-17 ENCOUNTER — Ambulatory Visit (INDEPENDENT_AMBULATORY_CARE_PROVIDER_SITE_OTHER): Payer: BC Managed Care – PPO

## 2019-09-17 ENCOUNTER — Ambulatory Visit: Payer: BC Managed Care – PPO | Admitting: Family Medicine

## 2019-09-17 ENCOUNTER — Encounter: Payer: Self-pay | Admitting: Family Medicine

## 2019-09-17 VITALS — BP 119/80 | HR 74 | Temp 98.6°F | Ht 66.0 in | Wt 165.0 lb

## 2019-09-17 DIAGNOSIS — M25561 Pain in right knee: Secondary | ICD-10-CM

## 2019-09-17 DIAGNOSIS — M67472 Ganglion, left ankle and foot: Secondary | ICD-10-CM

## 2019-09-17 DIAGNOSIS — M79642 Pain in left hand: Secondary | ICD-10-CM

## 2019-09-17 DIAGNOSIS — M79641 Pain in right hand: Secondary | ICD-10-CM

## 2019-09-17 DIAGNOSIS — R519 Headache, unspecified: Secondary | ICD-10-CM | POA: Diagnosis not present

## 2019-09-17 NOTE — Progress Notes (Signed)
Subjective:  Patient ID: Alexander George, male    DOB: 09/05/1955  Age: 64 y.o. MRN: [id number]  CC:  Chief Complaint  Patient presents with  . Headache    pain in tha back of head and along the R side of pt's head. pt states this pain happens once to a coulp[e times a year for the past 30 years.  . Arthritis    started a few months ago. pt has pain in both hands pt states his fingers her when he moves them or holds something even. pt is concerened it may be arthritis.  . Knee Pain    started a few months ago. pt states he thinks his R knee is tilted not strtaight like his other leg. pt states he may favor his R leg more that the left may be a cause .  . foot question    pt has a bone on his inner L foot that he doesn't feel in his R and would like to know what it is. no pain in that area. pt just noticed it resently.    HPI Alexander George presents for  Multiple concerns as above.  New patient to me.  Headache: Sx's intermittent - noted for 30 years, every few years. Lasts few seconds each time. Top of R scalp to back of R neck.  Few seconds, 2-3 times, lasted seconds. No rash. No weakness, slurred speech. No rash. No vision changes.  Pinching in neck at times, radiates to fingers -  left finger. Treated by Alexander George - discussed surgery, injection- declined both. Treated by chiropractor. Some relief.   Bilateral hand pain: Both hands, notes in all hand joints, but more sore/stiff in left thumb, R middle finger.  R hand dominant.  No meds.   Right knee pain: Pain for past few months. Nki. R leg shorter, feels like knee tilted. Notices with prolonged walling and running (90min).  Tx: none.  No prior R knee surgery (had left knee surgery long time ago). Saw. Alexander George for back issues. Saw him last week but did not mention knee.   Left foot bone question: Noticed area on inside of left medial foot last week. Nki, no pain, just feels different.  Patient has been evaluated by  podiatrist, Alexander George with x-rays in August 2020.  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  Treated with meloxicam at that time, pes planus and plantar fasciitis noted. - he did not take meloxicam - will not take meds unless severe or has to.    History Patient Active Problem List   Diagnosis Date Noted  . Degeneration of lumbar or lumbosacral intervertebral disc 07/30/2013  . OSA (obstructive sleep apnea) 03/16/2008   Past Medical History:  Diagnosis Date  . Heart murmur   . Seizures ([place]) 40 years ago    none since then per pt  . Sleep apnea    CPAP    Past Surgical History:  Procedure Laterality Date  . COLONOSCOPY  07/20/2005   Alexander George=normal exam   . HERNIA REPAIR    . KNEE SURGERY Left    6-7 years ago   No Known Allergies Prior to Admission medications   Not on File   Social History   Socioeconomic History  . Marital status: Married    Spouse name: Not on file  . Number of children: Not on file  . Years of education: Not on file  . Highest education level: Not on file  Occupational History  . Not on file  Tobacco Use  . Smoking status: Never Smoker  . Smokeless tobacco: Never Used  Substance and Sexual Activity  . Alcohol use: No  . Drug use: No  . Sexual activity: Not on file  Other Topics Concern  . Not on file  Social History Narrative  . Not on file   Social Determinants of Health   Financial Resource Strain:   . Difficulty of Paying Living Expenses:   Food Insecurity:   . Worried About Charity fundraiser in the Last Year:   . Arboriculturist in the Last Year:   Transportation Needs:   . Film/video editor (Medical):   Marland Kitchen Lack of Transportation (Non-Medical):   Physical Activity:   . Days of Exercise per Week:   . Minutes of Exercise per Session:   Stress:   . Feeling of Stress :   Social Connections:   . Frequency of Communication with Friends and Family:   . Frequency of Social Gatherings with Friends  and Family:   . Attends Religious Services:   . Active Member of Clubs or Organizations:   . Attends Archivist Meetings:   Marland Kitchen Marital Status:   Intimate Partner Violence:   . Fear of Current or Ex-Partner:   . Emotionally Abused:   Marland Kitchen Physically Abused:   . Sexually Abused:     Review of Systems Per HPI.   Objective:   Vitals:   09/17/19 1519  BP: 119/80  Pulse: 74  Temp: 98.6 F (37 C)  TempSrc: Temporal  SpO2: 95%  Weight: 165 lb (74.8 kg)  Height: 5\' 6"  (1.676 m)     Physical Exam Vitals reviewed.  Constitutional:      General: He is not in acute distress.    Appearance: He is well-developed.  HENT:     Head: Normocephalic and atraumatic.     Comments: Scalp skin intact, no vesicles, no dysesthesias, no focal tenderness.  Temporal scalp nontender without cords Cardiovascular:     Rate and Rhythm: Normal rate.  Pulmonary:     Effort: Pulmonary effort is normal.  Musculoskeletal:     Comments: C-spine, slight decreased range of motion, denies reproduction of scalp symptoms.  Right hand, no joint swelling, neurovascular intact distally, skin intact.  No focal tenderness.  No triggering of fingers specifically middle phalanx.  Full range of motion of middle phalanx.  Left hand: Minimal discomfort at the first [facility] to the first MCP.  No apparent swelling, skin intact, intact range of motion.  Thenar eminence normal bulk.  No triggering.  Right knee: Full range of motion, possible slight J sign with flexion, reports area of discomfort at posterior aspect of medial joint line, and below patella but no focal tenderness on exam.  Negative Clark's compression testing.  Fair VMO bulk.  Negative McMurray.  No apparent medial or lateral collateral laxity with varus/valgus stress.  Left foot small cystic appearing structure proximal to the first MTP, nontender.  Mobile.  Neurological:     Mental Status: He is alert and oriented to person, place, and time.    DG  Knee Complete 4 Views Right  Result Date: 09/17/2019 CLINICAL DATA:  Patellofemoral joint pain. EXAM: RIGHT KNEE - COMPLETE 4+ VIEW COMPARISON:  None. FINDINGS: Small knee joint effusion. Normal weight-bearing compartments. No gross changes of chondromalacia by radiography. IMPRESSION: Small knee joint effusion.  Otherwise normal radiographs. Electronically Signed   By: Nelson Chimes  M.D.   On: 09/17/2019 16:36   DG Finger Middle Right  Result Date: 09/17/2019 CLINICAL DATA:  Middle finger pain. EXAM: RIGHT MIDDLE FINGER 2+V COMPARISON:  None. FINDINGS: There is no evidence of fracture or dislocation. There is no evidence of arthropathy or other focal bone abnormality. Soft tissues are unremarkable. IMPRESSION: Normal radiographic appearance. Electronically Signed   By: Nelson Chimes M.D.   On: 09/17/2019 16:40   DG Finger Thumb Left  Result Date: 09/17/2019 CLINICAL DATA:  Thumb pain. EXAM: LEFT THUMB 2+V COMPARISON:  09/15/2014 FINDINGS: Osteoarthritis of the first carpometacarpal joint with subluxation and osteophyte formation. This is likely to be painful. No abnormality seen distal to that. IMPRESSION: Osteoarthritis of the first carpometacarpal joint with subluxation and osteophyte formation. Electronically Signed   By: Nelson Chimes M.D.   On: 09/17/2019 16:39   DG Foot Complete Left  Result Date: 09/17/2019 CLINICAL DATA:  Foot pain.  Left MTP cystic area. EXAM: LEFT FOOT - COMPLETE 3+ VIEW COMPARISON:  None. FINDINGS: No forefoot abnormality is seen. I do not see any lucent lesion of the metatarsal of the great toe. Question mild soft tissue swelling adjacent to the distal great toe metatarsal. This could be seen with gout. Otherwise no significant finding. IMPRESSION: No bone or joint abnormality seen. Question mild soft tissue fullness medial to the distal metatarsal of the great toe. This finding can be seen with gout. I do not see any erosions. Electronically Signed   By: Nelson Chimes M.D.   On:  09/17/2019 16:38       Assessment & Plan:  Alexander George is a 64 y.o. male . Right knee pain, unspecified chronicity - Plan: DG Knee Complete 4 Views Right, Apply knee sleeve  -Noted with prolonged walking, running.  Some findings of possible patellofemoral pain syndrome versus meniscal with medial discomfort.  Based on location could certainly have component of pes anserine bursitis but nontender on exam.  Declined medication, but will try basic hinged brace, VMO strengthening for possible patellofemoral pain, handout given, recheck 2 weeks.    Pain in both hands - Plan: DG Finger Thumb Left, DG Finger Middle Right  -Stiffness of right middle finger reported and pain at left CMC of thumb.  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  Potentially may need to see hand specialist for possible injection.  Will discuss further at follow-up visit.  No sign of triggering of middle finger and reassuring exam.  RTC precautions if worsening, recheck next few weeks  Ganglion cyst of left foot - Plan: DG Foot Complete Left  -Possible ganglion cyst versus other cystic structure at the medial foot.  Soft tissue swelling noted on x-ray without appreciable swelling noted on exam left foot.  Recommended follow-up with podiatrist.  Minimal symptoms at present.  Scalp pain  -Fleeting, rare symptoms.  Reassuring exam.  Degenerative changes were noted on cervical spine x-ray in 2016, and reports of possible cervical radiculopathy with left arm symptoms that have been treated by orthopedics.  Differential includes cervical spine source, occipital neuralgia, but fleeting symptoms and asymptomatic currently.  RTC precautions given if recurrent.  No orders of the defined types were placed in this encounter.  Patient Instructions    Area on foot appears to be possible cyst, will check an x-ray, but would recommend following up with your podiatrist to evaluate the area further.  Right knee  pain appears to be component of patellofemoral pain syndrome.  See information below.  I will check some x-rays.  Tylenol can be used over-the-counter as needed.  We can try the brace but as that is making symptoms worse then do not use it.  Work on strengthening of the quad muscle as we talked about, and recheck in 2 weeks.  Alternatively you can also follow-up with Alexander George.   I will check x-rays of your hand symptoms, but would like to discuss this further at next visit.  Potentially may need to see hand specialist if arthritis in the joints of hand, specifically thumb are the issue.  Symptoms on scalp/head could have been related to pinched nerves from neck.  As those are not currently bothering you, no further work-up at this time.  If symptoms return, follow-up to discuss further.  Return to the clinic or go to the nearest emergency room if any of your symptoms worsen or new symptoms occur.   Patellofemoral Pain Syndrome  Patellofemoral pain syndrome is a condition in which the tissue (cartilage) on the underside of the kneecap (patella) softens or breaks down. This causes pain in the front of the knee. The condition is also called runner's knee or chondromalacia patella. Patellofemoral pain syndrome is most common in young adults who are active in sports. The knee is the largest joint in the body. The patella covers the front of the knee and is attached to muscles above and below the knee. The underside of the patella is covered with a smooth type of cartilage (synovium). The smooth surface helps the patella to glide easily when you move your knee. Patellofemoral pain syndrome causes swelling in the joint linings and bone surfaces in the knee. What are the causes? This condition may be caused by:  Overuse of the knee.  Poor alignment of your knee joints.  Weak leg muscles.  A direct blow to your kneecap. What increases the risk? You are more likely to develop this condition if:  You  do a lot of activities that can wear down your kneecap. These include: ? Running. ? Squatting. ? Climbing stairs.  You start a new physical activity or exercise program.  You wear shoes that do not fit well.  You do not have good leg strength.  You are overweight. What are the signs or symptoms? The main symptom of this condition is knee pain. This may feel like a dull, aching pain underneath your patella, in the front of your knee. There may be a popping or cracking sound when you move your knee. Pain may get worse with:  Exercise.  Climbing stairs.  Running.  Jumping.  Squatting.  Kneeling.  Sitting for a long time.  Moving or pushing on your patella. How is this diagnosed? This condition may be diagnosed based on:  Your symptoms and medical history. You may be asked about your recent physical activities and which ones cause knee pain.  A physical exam. This may include: ? Moving your patella back and forth. ? Checking your range of knee motion. ? Having you squat or jump to see if you have pain. ? Checking the strength of your leg muscles.  Imaging tests to confirm the diagnosis. These may include an MRI of your knee. How is this treated? This condition may be treated at home with rest, ice, compression, and elevation (RICE).  Other treatments may include:  Nonsteroidal anti-inflammatory drugs (NSAIDs).  Physical therapy to stretch and strengthen your leg muscles.  Shoe inserts (orthotics) to take stress off your knee.  A knee  brace or knee support.  Adhesive tapes to the skin.  Surgery to remove damaged cartilage or move the patella to a better position. This is rare. Follow these instructions at home: If you have a shoe or brace:  Wear the shoe or brace as told by your health care provider. Remove it only as told by your health care provider.  Loosen the shoe or brace if your toes tingle, become numb, or turn cold and blue.  Keep the shoe or brace  clean.  If the shoe or brace is not waterproof: ? Do not let it get wet. ? Cover it with a watertight covering when you take a bath or a shower. Managing pain, stiffness, and swelling  If directed, put ice on the painful area. ? If you have a removable shoe or brace, remove it as told by your health care provider. ? Put ice in a plastic bag. ? Place a towel between your skin and the bag. ? Leave the ice on for 20 minutes, 2-3 times a day.  Move your toes often to avoid stiffness and to lessen swelling.  Rest your knee: ? Avoid activities that cause knee pain. ? When sitting or lying down, raise (elevate) the injured area above the level of your heart, whenever possible. General instructions  Take over-the-counter and prescription medicines only as told by your health care provider.  Use splints, braces, knee supports, or walking aids as directed by your health care provider.  Perform stretching and strengthening exercises as told by your health care provider or physical therapist.  Do not use any products that contain nicotine or tobacco, such as cigarettes and e-cigarettes. These can delay healing. If you need help quitting, ask your health care provider.  Return to your normal activities as told by your health care provider. Ask your health care provider what activities are safe for you.  Keep all follow-up visits as told by your health care provider. This is important. Contact a health care provider if:  Your symptoms get worse.  You are not improving with home care. Summary  Patellofemoral pain syndrome is a condition in which the tissue (cartilage) on the underside of the kneecap (patella) softens or breaks down.  This condition causes swelling in the joint linings and bone surfaces in the knee. This leads to pain in the front of the knee.  This condition may be treated at home with rest, ice, compression, and elevation (RICE).  Use splints, braces, knee supports, or  walking aids as directed by your health care provider. This information is not intended to replace advice given to you by your health care provider. Make sure you discuss any questions you have with your health care provider. Document Revised: 07/08/2017 Document Reviewed: 07/08/2017 Elsevier Patient Education  [employer].    If you have lab work done today you will be contacted with your lab results within the next 2 weeks.  If you have not heard from [place] then please contact us. The fastest way to get your results is to register for My Chart.   IF you received an x-ray today, you will receive an invoice from [facility]. Please contact [facility] at [phone] with questions or concerns regarding your invoice.   IF you received labwork today, you will receive an invoice from [place]. Please contact [facility] at [phone] with questions or concerns regarding your invoice.   Our billing staff will not be able to assist you with questions regarding bills from  these companies.  You will be contacted with the lab results as soon as they are available. The fastest way to get your results is to activate your My Chart account. Instructions are located on the last page of this paperwork. If you have not heard from [place] regarding the results in 2 weeks, please contact this office.         Signed, Merri Ray, MD [facility]

## 2019-09-17 NOTE — Patient Instructions (Addendum)
Area on foot appears to be possible cyst, will check an x-ray, but would recommend following up with your podiatrist to evaluate the area further.  Right knee pain appears to be component of patellofemoral pain syndrome.  See information below.  I will check some x-rays.  Tylenol can be used over-the-counter as needed.  We can try the brace but as that is making symptoms worse then do not use it.  Work on strengthening of the quad muscle as we talked about, and recheck in 2 weeks.  Alternatively you can also follow-up with Dr. Tonita Cong.   I will check x-rays of your hand symptoms, but would like to discuss this further at next visit.  Potentially may need to see hand specialist if arthritis in the joints of hand, specifically thumb are the issue.  Symptoms on scalp/head could have been related to pinched nerves from neck.  As those are not currently bothering you, no further work-up at this time.  If symptoms return, follow-up to discuss further.  Return to the clinic or go to the nearest emergency room if any of your symptoms worsen or new symptoms occur.   Patellofemoral Pain Syndrome  Patellofemoral pain syndrome is a condition in which the tissue (cartilage) on the underside of the kneecap (patella) softens or breaks down. This causes pain in the front of the knee. The condition is also called runner's knee or chondromalacia patella. Patellofemoral pain syndrome is most common in young adults who are active in sports. The knee is the largest joint in the body. The patella covers the front of the knee and is attached to muscles above and below the knee. The underside of the patella is covered with a smooth type of cartilage (synovium). The smooth surface helps the patella to glide easily when you move your knee. Patellofemoral pain syndrome causes swelling in the joint linings and bone surfaces in the knee. What are the causes? This condition may be caused by:  Overuse of the knee.  Poor  alignment of your knee joints.  Weak leg muscles.  A direct blow to your kneecap. What increases the risk? You are more likely to develop this condition if:  You do a lot of activities that can wear down your kneecap. These include: ? Running. ? Squatting. ? Climbing stairs.  You start a new physical activity or exercise program.  You wear shoes that do not fit well.  You do not have good leg strength.  You are overweight. What are the signs or symptoms? The main symptom of this condition is knee pain. This may feel like a dull, aching pain underneath your patella, in the front of your knee. There may be a popping or cracking sound when you move your knee. Pain may get worse with:  Exercise.  Climbing stairs.  Running.  Jumping.  Squatting.  Kneeling.  Sitting for a long time.  Moving or pushing on your patella. How is this diagnosed? This condition may be diagnosed based on:  Your symptoms and medical history. You may be asked about your recent physical activities and which ones cause knee pain.  A physical exam. This may include: ? Moving your patella back and forth. ? Checking your range of knee motion. ? Having you squat or jump to see if you have pain. ? Checking the strength of your leg muscles.  Imaging tests to confirm the diagnosis. These may include an MRI of your knee. How is this treated? This condition may be treated  at home with rest, ice, compression, and elevation (RICE).  Other treatments may include:  Nonsteroidal anti-inflammatory drugs (NSAIDs).  Physical therapy to stretch and strengthen your leg muscles.  Shoe inserts (orthotics) to take stress off your knee.  A knee brace or knee support.  Adhesive tapes to the skin.  Surgery to remove damaged cartilage or move the patella to a better position. This is rare. Follow these instructions at home: If you have a shoe or brace:  Wear the shoe or brace as told by your health care  provider. Remove it only as told by your health care provider.  Loosen the shoe or brace if your toes tingle, become numb, or turn cold and blue.  Keep the shoe or brace clean.  If the shoe or brace is not waterproof: ? Do not let it get wet. ? Cover it with a watertight covering when you take a bath or a shower. Managing pain, stiffness, and swelling  If directed, put ice on the painful area. ? If you have a removable shoe or brace, remove it as told by your health care provider. ? Put ice in a plastic bag. ? Place a towel between your skin and the bag. ? Leave the ice on for 20 minutes, 2-3 times a day.  Move your toes often to avoid stiffness and to lessen swelling.  Rest your knee: ? Avoid activities that cause knee pain. ? When sitting or lying down, raise (elevate) the injured area above the level of your heart, whenever possible. General instructions  Take over-the-counter and prescription medicines only as told by your health care provider.  Use splints, braces, knee supports, or walking aids as directed by your health care provider.  Perform stretching and strengthening exercises as told by your health care provider or physical therapist.  Do not use any products that contain nicotine or tobacco, such as cigarettes and e-cigarettes. These can delay healing. If you need help quitting, ask your health care provider.  Return to your normal activities as told by your health care provider. Ask your health care provider what activities are safe for you.  Keep all follow-up visits as told by your health care provider. This is important. Contact a health care provider if:  Your symptoms get worse.  You are not improving with home care. Summary  Patellofemoral pain syndrome is a condition in which the tissue (cartilage) on the underside of the kneecap (patella) softens or breaks down.  This condition causes swelling in the joint linings and bone surfaces in the knee. This  leads to pain in the front of the knee.  This condition may be treated at home with rest, ice, compression, and elevation (RICE).  Use splints, braces, knee supports, or walking aids as directed by your health care provider. This information is not intended to replace advice given to you by your health care provider. Make sure you discuss any questions you have with your health care provider. Document Revised: 07/08/2017 Document Reviewed: 07/08/2017 Elsevier Patient Education  [employer].    If you have lab work done today you will be contacted with your lab results within the next 2 weeks.  If you have not heard from [place] then please contact us. The fastest way to get your results is to register for My Chart.   IF you received an x-ray today, you will receive an invoice from [facility]. Please contact [facility]iology at [phone] with questions or concerns regarding your invoice.  IF you received labwork today, you will receive an invoice from [facility]. Please contact [facility] at [phone] with questions or concerns regarding your invoice.   Our billing staff will not be able to assist you with questions regarding bills from these companies.  You will be contacted with the lab results as soon as they are available. The fastest way to get your results is to activate your My Chart account. Instructions are located on the last page of this paperwork. If you have not heard from [place] regarding the results in 2 weeks, please contact this office.

## 2019-09-23 ENCOUNTER — Encounter: Payer: Self-pay | Admitting: Cardiology

## 2019-09-23 ENCOUNTER — Telehealth: Payer: Self-pay | Admitting: Cardiology

## 2019-09-23 NOTE — Telephone Encounter (Signed)
Unable to reach patient by telephone----will mail Cardiac MRI instructions to patient and request a return call if questions.

## 2019-09-23 NOTE — Telephone Encounter (Signed)
Patient called inquiring about Cardiac MRI appointment---informed patient it is scheduled for Tuesday 11/10/19 at 12:00pm at [facility]---arrival time is 11:15 am 1st floor radiology.

## 2019-09-25 ENCOUNTER — Telehealth: Payer: Self-pay | Admitting: Pulmonary Disease

## 2019-09-25 NOTE — Telephone Encounter (Signed)
Called pt X 3. Busy signal, will try again later.

## 2019-09-28 NOTE — Telephone Encounter (Signed)
Pt is scheduled an appt with Dr. Elsworth Soho tomorrow 4/20 so this can be discussed during that appt.

## 2019-09-28 NOTE — Telephone Encounter (Signed)
LMTCB

## 2019-09-28 NOTE — Telephone Encounter (Signed)
Patient is returning phone call. Patient phone number is [phone].

## 2019-09-28 NOTE — Telephone Encounter (Signed)
Called pt X 2 but there was no answer. Will try again later

## 2019-09-29 ENCOUNTER — Encounter: Payer: Self-pay | Admitting: Pulmonary Disease

## 2019-09-29 ENCOUNTER — Other Ambulatory Visit: Payer: Self-pay

## 2019-09-29 ENCOUNTER — Ambulatory Visit: Payer: BC Managed Care – PPO | Admitting: Pulmonary Disease

## 2019-09-29 ENCOUNTER — Other Ambulatory Visit: Payer: Self-pay | Admitting: *Deleted

## 2019-09-29 ENCOUNTER — Telehealth: Payer: Self-pay | Admitting: Pulmonary Disease

## 2019-09-29 DIAGNOSIS — G4733 Obstructive sleep apnea (adult) (pediatric): Secondary | ICD-10-CM

## 2019-09-29 NOTE — Progress Notes (Signed)
   Subjective:    Patient ID: Alexander George, male    DOB: 1956-06-05, 64 y.o.   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  Meanwhile he will got a good fitting nasal mask which he has been using and is very compliant  He developed chest pain and coronary CT showed RA/RV dilatation. 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 MRI is planned   Significant tests/ events reviewed  06/2001 NPSG  RDI was 21 with a REM RDI of 57. His oxygen nadir was 76%.  HST 08/2019 severe OSA, AHI 30/hour  Review of Systems Patient denies significant dyspnea,cough, hemoptysis,  chest pain, palpitations, pedal edema, orthopnea, paroxysmal nocturnal dyspnea, lightheadedness, nausea, vomiting, abdominal or  leg pains      Objective:   Physical Exam   Gen. Pleasant, thin man in no distress ENT - no thrush, no pallor/icterus,no post nasal drip Neck: No JVD, no thyromegaly, no carotid bruits Lungs: no use of accessory muscles, no dullness to percussion, clear without rales or rhonchi  Cardiovascular: Rhythm regular, heart sounds  normal, no murmurs or gallops, no peripheral edema Musculoskeletal: No deformities, no cyanosis or clubbing         Assessment & Plan:

## 2019-09-29 NOTE — Telephone Encounter (Signed)
Spoke with Sonia Baller, she states she needs a new order for CPAP. I advise dher that [facility] has the order but they are merging so there are some things they cant see. She was able tyo pull the order. Pt here in the office to see Dr. Elsworth Soho and he wrote another Rx for pt to be sent to [facility]. She will look out for the order. Nothing further is needed.

## 2019-09-29 NOTE — Assessment & Plan Note (Signed)
We reviewed his home sleep test in detail including implications of number of events, AHI and degree of desaturation.  Prescription for new auto CPAP 5 to 10 cm will be sent to DME  Weight loss encouraged, compliance with goal of at least 4-6 hrs every night is the expectation. Advised against medications with sedative side effects Cautioned against driving when sleepy - understanding that sleepiness will vary on a day to day basis   He does have dilation of RA and RV and primary mitral valve problem versus PFO is being considered, I do not think degree of desaturation and OSA is enough to cause this issue

## 2019-09-29 NOTE — Patient Instructions (Signed)
Prescription for new auto CPAP 5 to 10 cm will be sent to [facility]  Let me know if there are any issues Good luck with your cardiac testing

## 2019-09-30 ENCOUNTER — Telehealth: Payer: Self-pay | Admitting: Pulmonary Disease

## 2019-09-30 DIAGNOSIS — G4733 Obstructive sleep apnea (adult) (pediatric): Secondary | ICD-10-CM

## 2019-09-30 NOTE — Telephone Encounter (Signed)
Sonia Baller from [place] states order for CPAP machine needs to be signed by Dr. Elsworth Soho. Sonia Baller phone number is [phone].

## 2019-09-30 NOTE — Telephone Encounter (Signed)
Dr. Elsworth Soho - please sign pt's CPAP order.

## 2019-09-30 NOTE — Telephone Encounter (Signed)
No need to call pt. Will close encounter.

## 2019-09-30 NOTE — Telephone Encounter (Signed)
Do not have any orders to sign in my queue

## 2019-09-30 NOTE — Telephone Encounter (Signed)
Order has been signed I have called Sonia Baller and she is pulling the order I attempted to call the patient but received a fast busy signal

## 2019-09-30 NOTE — Telephone Encounter (Signed)
I placed the order today Dr. Elsworth Soho. Pt came by the office wanting to make sure his order was sent to [place]. Please make sure because pt is really anxious about getting CPAP.

## 2019-10-01 ENCOUNTER — Encounter: Payer: Self-pay | Admitting: Family Medicine

## 2019-10-01 ENCOUNTER — Other Ambulatory Visit: Payer: Self-pay

## 2019-10-01 ENCOUNTER — Ambulatory Visit: Payer: BC Managed Care – PPO | Admitting: Family Medicine

## 2019-10-01 VITALS — BP 120/73 | HR 67 | Temp 98.3°F | Ht 66.0 in | Wt 166.0 lb

## 2019-10-01 DIAGNOSIS — M79641 Pain in right hand: Secondary | ICD-10-CM

## 2019-10-01 DIAGNOSIS — M79642 Pain in left hand: Secondary | ICD-10-CM

## 2019-10-01 DIAGNOSIS — M25561 Pain in right knee: Secondary | ICD-10-CM

## 2019-10-01 DIAGNOSIS — M1812 Unilateral primary osteoarthritis of first carpometacarpal joint, left hand: Secondary | ICD-10-CM

## 2019-10-01 DIAGNOSIS — R2991 Unspecified symptoms and signs involving the musculoskeletal system: Secondary | ICD-10-CM

## 2019-10-01 DIAGNOSIS — R519 Headache, unspecified: Secondary | ICD-10-CM | POA: Diagnosis not present

## 2019-10-01 MED ORDER — MELOXICAM 7.5 MG PO TABS: 7.5000 mg | ORAL_TABLET | Freq: Every day | ORAL | 0 refills | Status: DC | PRN

## 2019-10-01 NOTE — Progress Notes (Signed)
Subjective:  Patient ID: Alexander George, male    DOB: [birth year]  Age: 64 y.o. MRN: [id number]  CC:  Chief Complaint  Patient presents with  . Follow-up    on pain on L side of pt's head, pain in both of the pt's hands, pain in R knee, and bone in L foot. pt states no changes to any of these issues. no new syomptoms.    HPI Alexander George presents for  Follow up concerns from last visit.   R knee pain: Possible patellofemoral pain last visit. Has been wearing hinged brace - has been doing better. Min discomfort at this time. No locking/giving way. Has been trying VMO strength at home as well. Would like to be referred to knee specialist anyway. He has orhto - no referral needed.  XR 4/8: IMPRESSION: Small knee joint effusion.  Otherwise normal radiographs.  Headache: See last 09/17/19. Infrequent. Has noticed more frequent sx's - every 5-6 days. Few seconds. - sharp R posterior/occipital scalp. Rarely to parietal area. No rash.  Tx: none.   B hand pain: Still having stiffness of all fingers, particularly R middle, L thumb more than others. No joint swelling. No sign of trigger at 4/8 visit.  Pain without triggering reported. No change since last vist XR middle finger R: IMPRESSION: Normal radiographic appearance L thumb: IMPRESSION: Osteoarthritis of the first carpometacarpal joint with subluxation and osteophyte formation. No PUD, no current meds. Would like to try antiinflammatory until seen by hand specialist.   L foot swelling/nodule.  eval on 4/8. Noted abnormality incidentally, no pain, not bothering him.  XR 4/8: IMPRESSION: No bone or joint abnormality seen. Question mild soft tissue fullness medial to the distal metatarsal of the great toe. This finding can be seen with gout. I do not see any erosions.   History Patient Active Problem List   Diagnosis Date Noted  . Degeneration of lumbar or lumbosacral intervertebral disc 07/30/2013  . OSA (obstructive sleep  apnea) 03/16/2008   Past Medical History:  Diagnosis Date  . Heart murmur   . Seizures ([place]) 40 years ago    none since then per pt  . Sleep apnea    CPAP    Past Surgical History:  Procedure Laterality Date  . COLONOSCOPY  07/20/2005   Dr.Kaplan=normal exam   . HERNIA REPAIR    . KNEE SURGERY Left    6-7 years ago   No Known Allergies Prior to Admission medications   Not on File   Social History   Socioeconomic History  . Marital status: Married    Spouse name: Not on file  . Number of children: Not on file  . Years of education: Not on file  . Highest education level: Not on file  Occupational History  . Not on file  Tobacco Use  . Smoking status: Never Smoker  . Smokeless tobacco: Never Used  Substance and Sexual Activity  . Alcohol use: No  . Drug use: No  . Sexual activity: Not on file  Other Topics Concern  . Not on file  Social History Narrative  . Not on file   Social Determinants of Health   Financial Resource Strain:   . Difficulty of Paying Living Expenses:   Food Insecurity:   . Worried About Charity fundraiser in the Last Year:   . Arboriculturist in the Last Year:   Transportation Needs:   . Film/video editor (Medical):   Marland Kitchen Lack of Transportation (  Non-Medical):   Physical Activity:   . Days of Exercise per Week:   . Minutes of Exercise per Session:   Stress:   . Feeling of Stress :   Social Connections:   . Frequency of Communication with Friends and Family:   . Frequency of Social Gatherings with Friends and Family:   . Attends Religious Services:   . Active Member of Clubs or Organizations:   . Attends Archivist Meetings:   Marland Kitchen Marital Status:   Intimate Partner Violence:   . Fear of Current or Ex-Partner:   . Emotionally Abused:   Marland Kitchen Physically Abused:   . Sexually Abused:     Review of Systems Per HPI.   Objective:   Vitals:   10/01/19 1447  BP: 120/73  Pulse: 67  Temp: 98.3 F (36.8 C)  TempSrc:  Temporal  SpO2: 98%  Weight: 166 lb (75.3 kg)  Height: 5\' 6"  (1.676 m)     Physical Exam Vitals reviewed.  Constitutional:      General: He is not in acute distress.    Appearance: He is well-developed.  HENT:     Head: Normocephalic and atraumatic.  Cardiovascular:     Rate and Rhythm: Normal rate.  Pulmonary:     Effort: Pulmonary effort is normal.  Musculoskeletal:     Comments: Full range of motion of all phalanges, no focal bony tenderness.  Neurological:     Mental Status: He is alert and oriented to person, place, and time.    No midline bony tenderness, pain-free range of motion of C-spine  Assessment & Plan:  Alexander George is a 64 y.o. male . Right knee pain, unspecified chronicity - Plan: meloxicam (MOBIC) 7.5 MG tablet  -Suspected patellofemoral pain, has improved.  He can continue bracing.  He plans to follow-up with his orthopedist.  Unilateral occipital headache - Plan: Ambulatory referral to Neurology  -Symptoms suspicious for occipital neuralgia.  Will refer to neurologist to discuss different treatment options.  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  There is no triggering noted on exam.  Abnormality noted on foot x-ray without pain or symptoms in that area.  Differential includes inflammatory arthropathy.  Will check uric acid, sed rate, RA/ANA.  Follow-up with hand specialist  for hand pain, further work-up to be determined by blood work.  RTC precautions  Meds ordered this encounter  Medications  . meloxicam (MOBIC) 7.5 MG tablet    Sig: Take 1 tablet (7.5 mg total) by mouth daily as needed for pain.    Dispense:  30 tablet    Refill:  0   Patient Instructions    Continue brace if needed for knee pain, but if not continuing to improve with exercise, I would recommend follow up with physical therapist, but you can discuss with your orthopaedic doctor - let me know if referral needed.   I will check some blood tests for the hand pain and an abnormal finding on your  foot to look for gout or other inflammation type of arthritis.  For now okay to try meloxicam once per day for hand pain, and will refer you to hand specialist.  I will refer you to headache specialist for the occipital headache.  Return to the clinic or go to the nearest emergency room if any of your symptoms worsen or new symptoms occur.   If you have lab work done today you will be contacted with your lab results within the next 2 weeks.  If you have not heard from [place] then please contact us. The fastest way to get your results is to register for My Chart.   IF you received an x-ray today, you will receive an invoice from [facility]. Please contact [facility]iology at [phone] with questions or concerns regarding your invoice.   IF you received labwork today, you will receive an invoice from [facility]. Please contact [facility] at [phone] with questions or concerns regarding your invoice.   Our billing staff will not be able to assist you with questions regarding bills from these companies.  You will be contacted with the lab results as soon as they are available. The fastest way to get your results is to activate your My Chart account. Instructions are located on the last page of this paperwork. If you have not heard from [place] regarding the results in 2 weeks, please contact  this office.         Signed, Merri Ray, MD [facility]

## 2019-10-01 NOTE — Patient Instructions (Addendum)
  Continue brace if needed for knee pain, but if not continuing to improve with exercise, I would recommend follow up with physical therapist, but you can discuss with your orthopaedic doctor - let me know if referral needed.   I will check some blood tests for the hand pain and an abnormal finding on your foot to look for gout or other inflammation type of arthritis.  For now okay to try meloxicam once per day for hand pain, and will refer you to hand specialist.  I will refer you to headache specialist for the occipital headache.  Return to the clinic or go to the nearest emergency room if any of your symptoms worsen or new symptoms occur.   If you have lab work done today you will be contacted with your lab results within the next 2 weeks.  If you have not heard from [place] then please contact us. The fastest way to get your results is to register for My Chart.   IF you received an x-ray today, you will receive an invoice from [facility] Radiology. Please contact [facility] Radiology at [phone] with questions or concerns regarding your invoice.   IF you received labwork today, you will receive an invoice from [place]. Please contact [facility] at [phone] with questions or concerns regarding your invoice.   Our billing staff will not be able to assist you with questions regarding bills from these companies.  You will be contacted with the lab results as soon as they are available. The fastest way to get your results is to activate your My Chart account. Instructions are located on the last page of this paperwork. If you have not heard from [place] regarding the results in 2 weeks, please contact this office.

## 2019-10-03 LAB — ANA,IFA RA DIAG PNL W/RFLX TIT/PATN
ANA Titer 1: NEGATIVE
Cyclic Citrullin Peptide Ab: 6 units (ref 0–19)
Rheumatoid fact SerPl-aCnc: <10 IU/mL (ref 0.0–13.9)

## 2019-10-03 LAB — URIC ACID: Uric Acid: 4.3 mg/dL (ref 3.8–8.4)

## 2019-10-03 LAB — SEDIMENTATION RATE: Sed Rate: 5 mm/hr (ref 0–30)

## 2019-10-07 ENCOUNTER — Telehealth: Payer: Self-pay | Admitting: General Practice

## 2019-10-07 NOTE — Telephone Encounter (Signed)
Pt came in and requested his labs from 10/01/19 be printed for him to take and when the provider looks over them pt would like a call regarding them. Please advise. [phone]

## 2019-10-08 ENCOUNTER — Encounter: Payer: Self-pay | Admitting: Neurology

## 2019-10-12 ENCOUNTER — Encounter: Payer: Self-pay | Admitting: Radiology

## 2019-10-14 ENCOUNTER — Telehealth: Payer: Self-pay | Admitting: Family Medicine

## 2019-10-14 NOTE — Telephone Encounter (Signed)
patient came in to check on status of referral for his hand . Patient got impatient and left . Marland Kitchen I think he was upset because no one called from [facility] 10/01/2019   Please reach out to patient

## 2019-10-29 NOTE — Progress Notes (Signed)
NEUROLOGY CONSULTATION NOTE  Alexander George MRN: [id number] DOB: Feb 22, 1956  Referring provider: Merri Ray, MD Primary care provider: Merri Ray, MD  Reason for consult:  Right occipital headache  HISTORY OF PRESENT ILLNESS: Alexander George is a 64 year old right-handed male with OSA, heart murmur and history of childhood generalized tonic clonic seizures who presents for right occipital headache.  History supplemented by referring provider's notes.  Since his early 70s, he has had paroxysmal right sided shooting pain from his temple radiating back to occiput.  It would occur infrequently but over the past year has been more frequent.  Sometimes it may radiate down the right posterior neck.  It occurs daily.  More recently, he has been experiencing dull right frontal/temporal headaches as well.  No visual disturbance, numbness and weakness.  He had an MRI of the cervical spine back in 05/28/2012 to evaluate right sided radiculopathy, which was personally reviewed and showed multilevel cervical spondylosis with bilateral foraminal stenosis potentially affecting right C4 nerve, bilateral C5 nerves, bilateral C6 nerves, and bilateral C7 nerves.  Over the past year, he reports language disturbance.  Sometimes, when he talks, he may speak jibberish for a few seconds.  Also, he reports having difficulty understanding some words when people talk to him.  For example, he may not understand the first couple of words in a sentence but then understands the rest of the sentence.  It does not affect reading or writing.  PAST MEDICAL HISTORY: Past Medical History:  Diagnosis Date  . Heart murmur   . Seizures ([place]) 40 years ago    none since then per pt  . Sleep apnea    CPAP     PAST SURGICAL HISTORY: Past Surgical History:  Procedure Laterality Date  . COLONOSCOPY  07/20/2005   Dr.Kaplan=normal exam   . HERNIA REPAIR    . KNEE SURGERY Left    6-7 years ago     MEDICATIONS: Current Outpatient Medications on File Prior to Visit  Medication Sig Dispense Refill  . meloxicam (MOBIC) 7.5 MG tablet Take 1 tablet (7.5 mg total) by mouth daily as needed for pain. 30 tablet 0   No current facility-administered medications on file prior to visit.    ALLERGIES: No Known Allergies  FAMILY HISTORY: Family History  Problem Relation Age of Onset  . Diabetes Father   . Cancer Sister        stomach  . Stomach cancer Sister   . Colon cancer Neg Hx   . Esophageal cancer Neg Hx   . Rectal cancer Neg Hx    SOCIAL HISTORY: Social History   Socioeconomic History  . Marital status: Married    Spouse name: Not on file  . Number of children: Not on file  . Years of education: Not on file  . Highest education level: Not on file  Occupational History  . Not on file  Tobacco Use  . Smoking status: Never Smoker  . Smokeless tobacco: Never Used  Substance and Sexual Activity  . Alcohol use: No  . Drug use: No  . Sexual activity: Not on file  Other Topics Concern  . Not on file  Social History Narrative  . Not on file   Social Determinants of Health   Financial Resource Strain:   . Difficulty of Paying Living Expenses:   Food Insecurity:   . Worried About Charity fundraiser in the Last Year:   . [place] in the Last Year:  Transportation Needs:   . Film/video editor (Medical):   Marland Kitchen Lack of Transportation (Non-Medical):   Physical Activity:   . Days of Exercise per Week:   . Minutes of Exercise per Session:   Stress:   . Feeling of Stress :   Social Connections:   . Frequency of Communication with Friends and Family:   . Frequency of Social Gatherings with Friends and Family:   . Attends Religious Services:   . Active Member of Clubs or Organizations:   . Attends Archivist Meetings:   Marland Kitchen Marital Status:   Intimate Partner Violence:   . Fear of Current or Ex-Partner:   . Emotionally Abused:   Marland Kitchen Physically  Abused:   . Sexually Abused:     PHYSICAL EXAM: Blood pressure 123/71, pulse 61, height 5\' 6"  (1.676 m), weight 166 lb 6.4 oz (75.5 kg), SpO2 99 %. General: No acute distress.  Patient appears well-groomed.   Head:  Normocephalic/atraumatic Eyes:  fundi examined but not visualized Neck: supple, no paraspinal tenderness, full range of motion Back: No paraspinal tenderness Heart: regular rate and rhythm Lungs: Clear to auscultation bilaterally. Vascular: No carotid bruits. Neurological Exam: Mental status: alert and oriented to person, place, and time, recent and remote memory intact, fund of knowledge intact, attention and concentration intact, speech fluent and not dysarthric, language intact. Cranial nerves: CN I: not tested CN II: pupils equal, round and reactive to light, visual fields intact CN III, IV, VI:  full range of motion, no nystagmus, no ptosis CN V: facial sensation intact CN VII: upper and lower face symmetric CN VIII: hearing intact CN IX, X: gag intact, uvula midline CN XI: sternocleidomastoid and trapezius muscles intact CN XII: tongue midline Bulk & Tone: normal, no fasciculations. Motor:  5/5 throughout  Sensation:  Pinprick and vibration sensation intact.   Deep Tendon Reflexes:  2+ throughout, toes downgoing.   Finger to nose testing:  Without dysmetria.   Heel to shin:  Without dysmetria.   Gait:  Normal station and stride. Romberg negative.  IMPRESSION: 1.  Possible cervicogenic headache/neuralgia 2.  Language dysfunction  PLAN: 1.  Check MRI of brain with and without contrast to evaluate for secondary causes of headache and aphasia 2.  Sed rate and CRP to evaluate for temporal arteritis. 3.  At this time, he defers treatment of headache 4.  Further recommendations pending results.  Thank you for allowing me to take part in the care of this patient.  Metta Clines, DO  CC: Merri Ray, MD

## 2019-10-30 ENCOUNTER — Encounter: Payer: Self-pay | Admitting: Neurology

## 2019-10-30 ENCOUNTER — Ambulatory Visit: Payer: BC Managed Care – PPO | Admitting: Neurology

## 2019-10-30 ENCOUNTER — Other Ambulatory Visit: Payer: Self-pay

## 2019-10-30 ENCOUNTER — Other Ambulatory Visit (INDEPENDENT_AMBULATORY_CARE_PROVIDER_SITE_OTHER): Payer: BC Managed Care – PPO

## 2019-10-30 VITALS — BP 123/71 | HR 61 | Ht 66.0 in | Wt 166.4 lb

## 2019-10-30 DIAGNOSIS — F809 Developmental disorder of speech and language, unspecified: Secondary | ICD-10-CM

## 2019-10-30 DIAGNOSIS — R519 Headache, unspecified: Secondary | ICD-10-CM

## 2019-10-30 DIAGNOSIS — R479 Unspecified speech disturbances: Secondary | ICD-10-CM | POA: Diagnosis not present

## 2019-10-30 NOTE — Addendum Note (Signed)
Addended by: Kaylyn Lim I on: 10/30/2019 02:30 PM   Modules accepted: Orders

## 2019-10-30 NOTE — Patient Instructions (Addendum)
1.  We will check MRI of brain with and without contrast. We have sent a referral to [facility] for your MRI and they will call you directly to schedule your appointment. They are located at [place]. If you need to contact them directly please call [phone].  2.  We will check sed rate and CRP 3.  Further recommendations pending results.

## 2019-10-31 LAB — C-REACTIVE PROTEIN: CRP: <1 mg/L (ref 0–10)

## 2019-10-31 LAB — SEDIMENTATION RATE: Sed Rate: 9 mm/hr (ref 0–30)

## 2019-11-05 ENCOUNTER — Telehealth: Payer: Self-pay | Admitting: Cardiology

## 2019-11-05 NOTE — Telephone Encounter (Signed)
Alexander George is calling stating his MR Cardiac scheduled for 11/10/19 is not covered by his insurance due to our office never requesting prior authorization. The patient's states to call [phone] to receive the approval as soon as possible. Please advise so the appointment doesn't have to be rescheduled.

## 2019-11-06 ENCOUNTER — Telehealth (HOSPITAL_COMMUNITY): Payer: Self-pay | Admitting: *Deleted

## 2019-11-06 NOTE — Telephone Encounter (Signed)
Reaching out to patient to offer assistance regarding upcoming cardiac imaging study; pt verbalizes understanding of appt date/time, parking situation and where to check in, and verified current allergies; name and call back number provided for further questions should they arise  [facility] [phone] office [phone] cell  Pt states that this test has not been approved by his insurance and he will only come if his insurance has approved the test.

## 2019-11-10 ENCOUNTER — Ambulatory Visit (HOSPITAL_COMMUNITY): Admission: RE | Admit: 2019-11-10 | Payer: BC Managed Care – PPO | Source: Ambulatory Visit

## 2019-11-10 ENCOUNTER — Telehealth: Payer: Self-pay | Admitting: Cardiology

## 2019-11-10 NOTE — Telephone Encounter (Signed)
New Message    Pt is calling and is wanting to change his MRI appt. He wants to be seen at [facility]    Please advise

## 2019-11-24 ENCOUNTER — Telehealth: Payer: Self-pay | Admitting: *Deleted

## 2019-11-24 NOTE — Telephone Encounter (Signed)
Left message for patient to call to discuss rescheduling the Cardiac MRI that was cancelled on 11/10/19

## 2019-11-26 NOTE — Telephone Encounter (Signed)
Left message for patient to call to discuss rescheduling the Cardiac MRI ordered by Dr. Gardiner Rhyme

## 2019-11-30 ENCOUNTER — Encounter: Payer: Self-pay | Admitting: Cardiology

## 2019-12-01 ENCOUNTER — Ambulatory Visit
Admission: RE | Admit: 2019-12-01 | Discharge: 2019-12-01 | Disposition: A | Discharge status: Home / Self Care | Payer: BC Managed Care – PPO | Source: Ambulatory Visit | Attending: Neurology | Admitting: Neurology

## 2019-12-01 ENCOUNTER — Other Ambulatory Visit: Payer: Self-pay | Admitting: Neurology

## 2019-12-01 ENCOUNTER — Other Ambulatory Visit: Payer: Self-pay

## 2019-12-01 DIAGNOSIS — R519 Headache, unspecified: Secondary | ICD-10-CM

## 2019-12-01 DIAGNOSIS — F809 Developmental disorder of speech and language, unspecified: Secondary | ICD-10-CM

## 2019-12-09 NOTE — Progress Notes (Signed)
NEUROLOGY FOLLOW UP OFFICE NOTE  Alexander George [id number]  HISTORY OF PRESENT ILLNESS: Alexander George is a 64 year old right-handed male with OSA, heart murmur and history of childhood generalized tonic clonic seizures who follows up for headache.  UPDATE: Sed rate and CRP from 10/30/2019 were 9 and <1 respectively.  MRI of brain without contrast on 12/02/2019 personally reviewed was unremarkable (he declined contrast).  HISTORY: Since his early 26s, he has had paroxysmal right sided shooting pain from his temple radiating back to occiput.  It would occur infrequently but over the past year has been more frequent.  Sometimes it may radiate down the right posterior neck.  It occurs daily.  More recently, he has been experiencing dull right frontal/temporal headaches as well.  No visual disturbance, numbness and weakness.  He had an MRI of the cervical spine back in 05/28/2012 to evaluate right sided radiculopathy, which was personally reviewed and showed multilevel cervical spondylosis with bilateral foraminal stenosis potentially affecting right C4 nerve, bilateral C5 nerves, bilateral C6 nerves, and bilateral C7 nerves.  He continues to have left cervical radicular pain radiating down the left arm and into the index finger.  Chiropractic medicine was ineffective.  He declined surgery.    Over the past year, he reports language disturbance.  Sometimes, when he talks, he may speak jibberish for a few seconds.  Also, he reports having difficulty understanding some words when people talk to him.  For example, he may not understand the first couple of words in a sentence but then understands the rest of the sentence.  It does not affect reading or writing.  PAST MEDICAL HISTORY: Past Medical History:  Diagnosis Date  . Heart murmur   . Seizures ([place]) 40 years ago    none since then per pt  . Sleep apnea    CPAP     MEDICATIONS: Current Outpatient Medications on File Prior to Visit    Medication Sig Dispense Refill  . meloxicam (MOBIC) 7.5 MG tablet Take 1 tablet (7.5 mg total) by mouth daily as needed for pain. (Patient not taking: Reported on 10/30/2019) 30 tablet 0   No current facility-administered medications on file prior to visit.    ALLERGIES: No Known Allergies  FAMILY HISTORY: Family History  Problem Relation Age of Onset  . Diabetes Father   . Cancer Sister        stomach  . Stomach cancer Sister   . Colon cancer Neg Hx   . Esophageal cancer Neg Hx   . Rectal cancer Neg Hx    SOCIAL HISTORY: Social History   Socioeconomic History  . Marital status: Married    Spouse name: Not on file  . Number of children: Not on file  . Years of education: Not on file  . Highest education level: Not on file  Occupational History  . Not on file  Tobacco Use  . Smoking status: Never Smoker  . Smokeless tobacco: Never Used  Vaping Use  . Vaping Use: Never used  Substance and Sexual Activity  . Alcohol use: No  . Drug use: No  . Sexual activity: Not on file  Other Topics Concern  . Not on file  Social History Narrative   Right handed   Lives with wife in a two story home.   Social Determinants of Health   Financial Resource Strain:   . Difficulty of Paying Living Expenses:   Food Insecurity:   . Worried About Charity fundraiser  in the Last Year:   . [place] in the Last Year:   Transportation Needs:   . Film/video editor (Medical):   Marland Kitchen Lack of Transportation (Non-Medical):   Physical Activity:   . Days of Exercise per Week:   . Minutes of Exercise per Session:   Stress:   . Feeling of Stress :   Social Connections:   . Frequency of Communication with Friends and Family:   . Frequency of Social Gatherings with Friends and Family:   . Attends Religious Services:   . Active Member of Clubs or Organizations:   . Attends Archivist Meetings:   Marland Kitchen Marital Status:   Intimate Partner Violence:   . Fear of Current or  Ex-Partner:   . Emotionally Abused:   Marland Kitchen Physically Abused:   . Sexually Abused:     PHYSICAL EXAM: Blood pressure 133/73, pulse 69, height 5\' 6"  (1.676 m), weight 166 lb (75.3 kg), SpO2 98 %. General: No acute distress.  Patient appears well-groomed.   Head:  Normocephalic/atraumatic Eyes:  Fundi examined but not visualized Neck: supple, right sided paraspinal tenderness, full range of motion Heart:  Regular rate and rhythm Lungs:  Clear to auscultation bilaterally Back: No paraspinal tenderness Neurological Exam: alert and oriented to person, place, and time. Attention span and concentration intact, recent and remote memory intact, fund of knowledge intact.  Speech fluent and not dysarthric, language intact.  CN II-XII intact. Bulk and tone normal, muscle strength 5/5 throughout.  Sensation to light touch, temperature and vibration intact.  Deep tendon reflexes 2+ throughout, toes downgoing.  Finger to nose and heel to shin testing intact.  Gait normal, Romberg negative.  IMPRESSION: 1.  Right sided headache.  Suspect cervicogenic or possibly occipital neuralgia.  At this time, he declines physical therapy for the neck and pharmacologic management as it is manageable. 2.  Recurrent speech disturbance.  MRI of brain unremarkable.  Given remote history of isolated seizure, will evaluate with EEG.  PLAN: 1.  Routine EEG.  Further recommendations pending results. 2.  Otherwise, follow up as needed.  Metta Clines, DO  CC: Merri Ray, MD

## 2019-12-11 ENCOUNTER — Encounter: Payer: Self-pay | Admitting: Neurology

## 2019-12-11 ENCOUNTER — Ambulatory Visit: Payer: BC Managed Care – PPO | Admitting: Neurology

## 2019-12-11 ENCOUNTER — Other Ambulatory Visit: Payer: Self-pay

## 2019-12-11 VITALS — BP 133/73 | HR 69 | Ht 66.0 in | Wt 166.0 lb

## 2019-12-11 DIAGNOSIS — M5481 Occipital neuralgia: Secondary | ICD-10-CM

## 2019-12-11 DIAGNOSIS — R519 Headache, unspecified: Secondary | ICD-10-CM | POA: Diagnosis not present

## 2019-12-11 DIAGNOSIS — G4486 Cervicogenic headache: Secondary | ICD-10-CM

## 2019-12-11 DIAGNOSIS — Z87898 Personal history of other specified conditions: Secondary | ICD-10-CM

## 2019-12-11 DIAGNOSIS — R479 Unspecified speech disturbances: Secondary | ICD-10-CM | POA: Diagnosis not present

## 2019-12-11 NOTE — Patient Instructions (Signed)
Will order routine EEG.  Further recommendations pending results.

## 2019-12-23 ENCOUNTER — Ambulatory Visit (INDEPENDENT_AMBULATORY_CARE_PROVIDER_SITE_OTHER): Payer: BC Managed Care – PPO | Admitting: Neurology

## 2019-12-23 ENCOUNTER — Other Ambulatory Visit: Payer: Self-pay

## 2019-12-23 DIAGNOSIS — R479 Unspecified speech disturbances: Secondary | ICD-10-CM

## 2019-12-23 DIAGNOSIS — Z87898 Personal history of other specified conditions: Secondary | ICD-10-CM

## 2019-12-23 NOTE — Procedures (Signed)
ELECTROENCEPHALOGRAM REPORT  Date of Study: 12/23/2019  Patient's Name: Alexander George MRN: [id number] Date of Birth: 07-03-1955  Clinical History: 64 year old male with remote history of seizure presents with headaches and recurrent speech disturbance.  Medications: Mobic  Technical Summary: A multichannel digital EEG recording measured by the international 10-20 system with electrodes applied with paste and impedances below 5000 ohms performed in our laboratory with EKG monitoring in an awake and asleep patient.  Hyperventilation was not performed as patient is wearing a mask due to the [place] pandemic.  Photic stimulation was performed.  The digital EEG was referentially recorded, reformatted, and digitally filtered in a variety of bipolar and referential montages for optimal display.    Description: The patient is awake and asleep during the recording.  During maximal wakefulness, there is a symmetric, medium voltage 9 Hz posterior dominant rhythm that attenuates with eye opening.  The record is symmetric.  During drowsiness and sleep, there is an increase in theta slowing of the background.  Stage 2 sleep was seen.  Photic stimulation did not elicit any abnormalities.  There were no epileptiform discharges or electrographic seizures seen.    EKG lead was unremarkable.  Impression: This awake and asleep EEG is normal.    Clinical Correlation: A normal EEG does not exclude a clinical diagnosis of epilepsy.  If further clinical questions remain, prolonged EEG may be helpful.  Clinical correlation is advised.   Metta Clines, DO

## 2019-12-28 ENCOUNTER — Ambulatory Visit: Payer: BC Managed Care – PPO | Admitting: Neurology

## 2020-03-01 ENCOUNTER — Encounter: Payer: Self-pay | Admitting: *Deleted

## 2020-03-02 ENCOUNTER — Telehealth: Payer: Self-pay | Admitting: Cardiology

## 2020-03-02 NOTE — Telephone Encounter (Signed)
Patient called and wanted Cardiac MRI scheduled before 03/08/20--his insurance is changing then---scheduled for Thursday 03/08/20 at 8:00 am at [facility]--arrival time is 7:30 am 1st floor admissions office for check in.  Patient voiced his understanding.

## 2020-03-03 ENCOUNTER — Ambulatory Visit (HOSPITAL_COMMUNITY)
Admission: RE | Admit: 2020-03-03 | Discharge: 2020-03-03 | Disposition: A | Discharge status: Home / Self Care | Payer: BC Managed Care – PPO | Source: Ambulatory Visit | Attending: Cardiology | Admitting: Cardiology

## 2020-03-03 ENCOUNTER — Other Ambulatory Visit: Payer: Self-pay

## 2020-03-03 ENCOUNTER — Other Ambulatory Visit: Payer: Self-pay | Admitting: Cardiology

## 2020-03-03 DIAGNOSIS — I059 Rheumatic mitral valve disease, unspecified: Secondary | ICD-10-CM | POA: Diagnosis not present

## 2020-03-03 DIAGNOSIS — I517 Cardiomegaly: Secondary | ICD-10-CM

## 2020-03-14 ENCOUNTER — Other Ambulatory Visit (HOSPITAL_COMMUNITY): Payer: BC Managed Care – PPO

## 2020-03-20 NOTE — Progress Notes (Signed)
Cardiology Office Note:    Date:  03/24/2020   ID:  Alexander George, DOB Oct 04, 1955, MRN [id number]  PCP:  Wendie Agreste, MD  Cardiologist:  Donato Heinz, MD  Electrophysiologist:  None   Referring MD: Wendie Agreste, MD   No chief complaint on file.   History of Present Illness:    Alexander George is a 64 y.o. male with a hx of seizures, OSA who presents for follow-up.  He was referred by Dr.Sagardia for an evaluation of chest pain, initially seen on 07/13/2019.  Started having chest pain 10 years ago, has been chronic issue.  However, for 2-3 weeks prior to initial appointment, has occurred every 2-3 days.  Reports left-sided pain, describes as pinching.  Lasts for about 1 minute and resolves.  Can occur while resting or when walking.  He reports that he walks 20 to 30 minutes/day and has not noted relationship with chest pain and exertion.  Denies any dyspnea,  lightheadedness, syncope, or LE edema.  Reports palpitations occur rarely (every 4-5 months).  Feels like heart is racing,  lasts few seconds and resolves.  Never smoked.  No heart disease in immediate family.    Coronary CTA on 08/25/2019 showed normal coronary arteries but moderate right atrial/right ventricular dilatation.  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  Cardiac MRI on 03/03/2020 showed QP/QS 1.1, suggesting no significant shunt, mild RV dilatation with normal systolic function (EF 62%), normal LV size and systolic function (EF 37%), mild TR/PI/AI.  Patient declined contrast administration.  Since last clinic visit, he reports that he has been doing okay.  Reports chest pain has improved.  Denies any dyspnea.  Does report he has been having palpitations.  Previously was occurring once every few months, but  states recently has been happening 3-4 times per week.  Typically lasts for a few seconds and resolves.  Denies any lightheadedness or syncope.   Past Medical History:  Diagnosis Date  . Heart murmur   . Seizures ([place]) 40 years ago    none since then per pt  . Sleep apnea    CPAP     Past Surgical History:  Procedure Laterality Date  . COLONOSCOPY  07/20/2005   Dr.Kaplan=normal exam   . HERNIA REPAIR    . KNEE SURGERY Left    6-7 years ago    Current Medications: No outpatient medications have been marked as taking for the 03/24/20 encounter (Office Visit) with Donato Heinz, MD.     Allergies:   Patient has no known allergies.   Social History   Socioeconomic History  . Marital status: Married    Spouse name: Not on file  . Number of children: Not on file  . Years of education: Not on file  . Highest education level: Not on file  Occupational History  . Not on file  Tobacco Use  . Smoking status: Never Smoker  . Smokeless tobacco: Never Used  Vaping Use  . Vaping Use: Never used  Substance and Sexual Activity  . Alcohol use: No  . Drug use: No  . Sexual activity: Not on file  Other Topics Concern  . Not on file  Social History Narrative   Right handed   Lives with wife in a two story home.   Social Determinants of Health  Financial Resource Strain:   . Difficulty of Paying Living Expenses: Not on file  Food Insecurity:   . Worried About Charity fundraiser in the Last Year: Not on file  . Ran Out of Food in the Last Year: Not on file  Transportation Needs:   . Lack of Transportation (Medical): Not on file  . Lack of Transportation (Non-Medical): Not on file  Physical Activity:   . Days of Exercise per Week: Not on file  . Minutes of Exercise per Session: Not on file  Stress:   . Feeling of Stress : Not on file  Social Connections:   . Frequency of Communication with Friends and Family: Not on file  . Frequency of Social Gatherings with  Friends and Family: Not on file  . Attends Religious Services: Not on file  . Active Member of Clubs or Organizations: Not on file  . Attends Archivist Meetings: Not on file  . Marital Status: Not on file     Family History: The patient's family history includes Cancer in his sister; Diabetes in his father; Stomach cancer in his sister. There is no history of Colon cancer, Esophageal cancer, or Rectal cancer.  ROS:   Please see the history of present illness.     All other systems reviewed and are negative.  EKGs/Labs/Other Studies Reviewed:    The following studies were reviewed today:   EKG:  EKG is not ordered today.  The ekg ordered most recently demonstrates normal sinus rhythm, rate 67, no ST/T abnormalities  TTE 09/11/2019: 1. Left ventricular ejection fraction, by estimation, is 55%. The left  ventricle has normal function. The left ventricle has no regional wall  motion abnormalities. There is mild asymmetric left ventricular  hypertrophy of the posterior-lateral segment.  Left ventricular diastolic parameters are consistent with Grade I  diastolic dysfunction (impaired relaxation).  2. Right ventricular systolic function is normal. The right ventricular  size is moderately enlarged. There is normal pulmonary artery systolic  pressure. The estimated right ventricular systolic pressure is 91.4 mmHg.  3. 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  The mitral valve is abnormal. Trivial mitral valve regurgitation. No  evidence of mitral stenosis.  4. Tricuspid valve regurgitation is mild to moderate.  5. The aortic valve is tricuspid. Aortic valve regurgitation is not  visualized. No aortic stenosis is present.  6. There is dilatation of the ascending aorta.  7. The inferior vena cava is normal in size with greater than 50%  respiratory variability, suggesting right atrial pressure of 3 mmHg.  Coronary CTA 08/25/19: 1. Coronary  calcium score of 0. 2. No evidence of CAD. 3. Moderate right atrial and right ventricular dilatation  CAD-RADS 0. No evidence of CAD (0%). Consider non-atherosclerotic causes of chest pain.  IMPRESSION: Mild cardiomegaly.  No acute extra cardiac abnormality.   Recent Labs: 08/19/2019: BUN 16; Creatinine, Ser 0.92; Potassium 4.3; Sodium 139  Recent Lipid Panel    Component Value Date/Time   CHOL 137 01/22/2018 0943   TRIG 65 01/22/2018 0943   HDL 66 01/22/2018 0943   CHOLHDL 2.1 01/22/2018 0943   CHOLHDL 2.5 04/27/2015 1354   VLDL 18 04/27/2015 1354   LDLCALC 58 01/22/2018 0943    Physical Exam:    VS:  BP 100/62   Pulse 60   Ht 5\' 7"  (1.702 m)   Wt 163 lb (73.9 kg)   SpO2 97%   BMI 25.53 kg/m     Wt  Readings from Last 3 Encounters:  03/24/20 163 lb (73.9 kg)  12/11/19 166 lb (75.3 kg)  10/30/19 166 lb 6.4 oz (75.5 kg)     GEN:  Well nourished, well developed in no acute distress HEENT: Normal NECK: No JVD LYMPHATICS: No lymphadenopathy CARDIAC: RRR, no murmurs RESPIRATORY:  Clear to auscultation without rales, wheezing or rhonchi  ABDOMEN: Soft, non-tender, non-distended MUSCULOSKELETAL:  No edema; No deformity  SKIN: Warm and dry NEUROLOGIC:  Alert and oriented x 3 PSYCHIATRIC:  Normal affect   ASSESSMENT:    1. Palpitations   2. Right ventricular dilation   3. Chest pain of uncertain etiology   4. OSA (obstructive sleep apnea)    PLAN:     Chest pain: Atypical in description.  Coronary CTA shows normal coronary arteries.  Suspect noncardiac chest pain, no further cardiac work-up recommended  RV dilatation: Moderate RV dilatation on TTE.  Unclear cause, concerning for shunt.  Cardiac MRI on 03/03/2020 showed QP/QS 1.1, suggesting no significant shunt, mild RV dilatation with normal systolic function (EF 40%), normal LV size and systolic function (EF 98%), mild TR/PI/AI.    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  Palpitations: Description concerning for arrhythmia, will check Zio patch x14 days.  OSA: On CPAP.  Encourage compliance  RTC in 6 months   Medication Adjustments/Labs and Tests Ordered: Current medicines are reviewed at length with the patient today.  Concerns regarding medicines are outlined above.  Orders Placed This Encounter  Procedures  . LONG TERM MONITOR (3-14 DAYS)  . EKG 12-Lead   No orders of the defined types were placed in this encounter.   Patient Instructions  Medication Instructions:  Your physician recommends that you continue on your current medications as directed. Please refer to the Current Medication list given to you today.  *If you need a refill on your cardiac medications before your next appointment, please call your pharmacy*  Testing/Procedures:  [employer] Monitor Instructions   Your physician has requested you wear your ZIO patch monitor 14 days.   This is a single patch monitor.  [employer] supplies one patch monitor per enrollment.  Additional stickers are not available.   Please do not apply patch if you will be having a Nuclear Stress Test, Echocardiogram, Cardiac CT, MRI, or Chest Xray during the time frame you would be wearing the monitor. The patch cannot be worn during these tests.  You cannot remove and re-apply the ZIO XT patch monitor.   Your ZIO patch monitor will be sent USPS Priority mail from [facility] directly to your home address. The monitor may also be mailed to a PO BOX if home delivery is not available.   It may take 3-5 days to receive your monitor after you have been enrolled.   Once you have received you monitor, please review enclosed instructions.  Your monitor has already been registered assigning a specific monitor serial # to you.   Applying the monitor   Shave hair from upper left chest.   Hold abrader disc by orange tab.  Rub abrader in 40 strokes over left upper chest as indicated in your  monitor instructions.   Clean area with 4 enclosed alcohol pads .  Use all pads to assure are is cleaned thoroughly.  Let dry.   Apply patch as indicated in monitor instructions.  Patch will be place under collarbone on left side of chest with arrow pointing upward.  Rub patch adhesive wings for 2 minutes.Remove white label marked "1".  Remove white label marked "2".  Rub patch adhesive wings for 2 additional minutes.   While looking in a mirror, press and release button in center of patch.  A small green light will flash 3-4 times .  This will be your only indicator the monitor has been turned on.     Do not shower for the first 24 hours.  You may shower after the first 24 hours.   Press button if you feel a symptom. You will hear a small click.  Record Date, Time and Symptom in the Patient Log Book.   When you are ready to remove patch, follow instructions on last 2 pages of Patient Log Book.  Stick patch monitor onto last page of Patient Log Book.   Place Patient Log Book in [place] box.  Use locking tab on box and tape box closed securely.  The [employer] has [employer] on it.  Please place in mailbox as soon as possible.  Your physician should have your test results approximately 7 days after the monitor has been mailed back to [facility].   Call [facility] at [phone] if you have questions regarding your ZIO XT patch monitor.  Call them immediately if you see an orange light blinking on your monitor.   If your monitor falls off in less than 4 days contact our Monitor department at [phone].  If your monitor becomes loose or falls off after 4 days call [employer] at [phone] for suggestions on securing your monitor.     Follow-Up: At [facility], you and your health needs are our priority.  As part of our continuing mission to provide you with exceptional heart care, we have created designated Provider Care Teams.  These Care Teams  include your primary Cardiologist (physician) and Advanced Practice Providers (APPs -  Physician Assistants and Nurse Practitioners) who all work together to provide you with the care you need, when you need it.  We recommend signing up for the patient portal called "MyChart".  Sign up information is provided on this After Visit Summary.  MyChart is used to connect with patients for Virtual Visits (Telemedicine).  Patients are able to view lab/test results, encounter notes, upcoming appointments, etc.  Non-urgent messages can be sent to your provider as well.   To learn more about what you can do with MyChart, go to [email or web address].    Your next appointment:   6 month(s)  The format for your next appointment:   In Person  Provider:   Oswaldo Milian, MD       Signed, Donato Heinz, MD  03/24/2020 1:09 PM    [place]

## 2020-03-24 ENCOUNTER — Ambulatory Visit (INDEPENDENT_AMBULATORY_CARE_PROVIDER_SITE_OTHER): Payer: BC Managed Care – PPO | Admitting: Cardiology

## 2020-03-24 ENCOUNTER — Encounter: Payer: Self-pay | Admitting: *Deleted

## 2020-03-24 ENCOUNTER — Other Ambulatory Visit: Payer: Self-pay

## 2020-03-24 ENCOUNTER — Encounter: Payer: Self-pay | Admitting: Cardiology

## 2020-03-24 VITALS — BP 100/62 | HR 60 | Ht 67.0 in | Wt 163.0 lb

## 2020-03-24 DIAGNOSIS — G4733 Obstructive sleep apnea (adult) (pediatric): Secondary | ICD-10-CM | POA: Diagnosis not present

## 2020-03-24 DIAGNOSIS — R079 Chest pain, unspecified: Secondary | ICD-10-CM

## 2020-03-24 DIAGNOSIS — I517 Cardiomegaly: Secondary | ICD-10-CM | POA: Diagnosis not present

## 2020-03-24 DIAGNOSIS — R002 Palpitations: Secondary | ICD-10-CM

## 2020-03-24 NOTE — Patient Instructions (Signed)
Medication Instructions:  Your physician recommends that you continue on your current medications as directed. Please refer to the Current Medication list given to you today.  *If you need a refill on your cardiac medications before your next appointment, please call your pharmacy*  Testing/Procedures:  ZIO XT- Long Term Monitor Instructions   Your physician has requested you wear your ZIO patch monitor____14___days.   This is a single patch monitor.  [employer] supplies one patch monitor per enrollment.  Additional stickers are not available.   Please do not apply patch if you will be having a Nuclear Stress Test, Echocardiogram, Cardiac CT, MRI, or Chest Xray during the time frame you would be wearing the monitor. The patch cannot be worn during these tests.  You cannot remove and re-apply the ZIO XT patch monitor.   Your ZIO patch monitor will be sent USPS Priority mail from [employer] directly to your home address. The monitor may also be mailed to a PO BOX if home delivery is not available.   It may take 3-5 days to receive your monitor after you have been enrolled.   Once you have received you monitor, please review enclosed instructions.  Your monitor has already been registered assigning a specific monitor serial # to you.   Applying the monitor   Shave hair from upper left chest.   Hold abrader disc by orange tab.  Rub abrader in 40 strokes over left upper chest as indicated in your monitor instructions.   Clean area with 4 enclosed alcohol pads .  Use all pads to assure are is cleaned thoroughly.  Let dry.   Apply patch as indicated in monitor instructions.  Patch will be place under collarbone on left side of chest with arrow pointing upward.   Rub patch adhesive wings for 2 minutes.Remove white label marked "1".  Remove white label marked "2".  Rub patch adhesive wings for 2 additional minutes.   While looking in a mirror, press and release button in center of patch.   A small green light will flash 3-4 times .  This will be your only indicator the monitor has been turned on.     Do not shower for the first 24 hours.  You may shower after the first 24 hours.   Press button if you feel a symptom. You will hear a small click.  Record Date, Time and Symptom in the Patient Log Book.   When you are ready to remove patch, follow instructions on last 2 pages of Patient Log Book.  Stick patch monitor onto last page of Patient Log Book.   Place Patient Log Book in [place] box.  Use locking tab on box and tape box closed securely.  The [employer] box has prepaid postage on it.  Please place in mailbox as soon as possible.  Your physician should have your test results approximately 7 days after the monitor has been mailed back to [employer].   Call [employer] Customer Care at [phone] if you have questions regarding your ZIO XT patch monitor.  Call them immediately if you see an orange light blinking on your monitor.   If your monitor falls off in less than 4 days contact our Monitor department at [phone].  If your monitor becomes loose or falls off after 4 days call [employer] at [phone] for suggestions on securing your monitor.   Follow-Up: At [facility], you and your health needs are our priority.  As part of our continuing   mission to provide you with exceptional heart care, we have created designated Provider Care Teams.  These Care Teams include your primary Cardiologist (physician) and Advanced Practice Providers (APPs -  Physician Assistants and Nurse Practitioners) who all work together to provide you with the care you need, when you need it.  We recommend signing up for the patient portal called "MyChart".  Sign up information is provided on this After Visit Summary.  MyChart is used to connect with patients for Virtual Visits (Telemedicine).  Patients are able to view lab/test results, encounter notes, upcoming appointments, etc.   Non-urgent messages can be sent to your provider as well.   To learn more about what you can do with MyChart, go to [email or web address].    Your next appointment:   6 month(s)  The format for your next appointment:   In Person  Provider:   Christopher Schumann, MD   

## 2020-03-24 NOTE — Progress Notes (Signed)
Patient ID: Alexander George, male   DOB: 1956-03-26, 64 y.o.   MRN: [id number] Patient enrolled for [employer] to ship a 14 day ZIO XT long term holter monitor to his home.

## 2020-03-30 ENCOUNTER — Other Ambulatory Visit: Payer: Self-pay

## 2020-03-30 ENCOUNTER — Ambulatory Visit (INDEPENDENT_AMBULATORY_CARE_PROVIDER_SITE_OTHER): Payer: BLUE CROSS/BLUE SHIELD | Admitting: Family Medicine

## 2020-03-30 ENCOUNTER — Encounter: Payer: Self-pay | Admitting: Family Medicine

## 2020-03-30 VITALS — BP 117/76 | HR 64 | Temp 98.4°F | Ht 67.0 in | Wt 164.0 lb

## 2020-03-30 DIAGNOSIS — Z125 Encounter for screening for malignant neoplasm of prostate: Secondary | ICD-10-CM

## 2020-03-30 DIAGNOSIS — Z130 Encounter for screening for diseases of the blood and blood-forming organs and certain disorders involving the immune mechanism: Secondary | ICD-10-CM

## 2020-03-30 DIAGNOSIS — Z0000 Encounter for general adult medical examination without abnormal findings: Secondary | ICD-10-CM

## 2020-03-30 DIAGNOSIS — Z131 Encounter for screening for diabetes mellitus: Secondary | ICD-10-CM | POA: Diagnosis not present

## 2020-03-30 DIAGNOSIS — Z13220 Encounter for screening for lipoid disorders: Secondary | ICD-10-CM

## 2020-03-30 NOTE — Patient Instructions (Addendum)
Low intensity exercise (if ok with cardiology) most days per week, goal of 150 minutes per week.   I do recommend shingrix vaccine - that can be given here or pharmacy.   I am happy to discuss testosterone and replacement further. If we do check that it should be between 8 and 10am.   Thanks for coming in today.    Keeping you healthy  Get these tests  Blood pressure- Have your blood pressure checked once a year by your healthcare provider.  Normal blood pressure is 120/80  Weight- Have your body mass index (BMI) calculated to screen for obesity.  BMI is a measure of body fat based on height and weight. You can also calculate your own BMI at ViewBanking.[email or web address].  Cholesterol- Have your cholesterol checked every year.  Diabetes- Have your blood sugar checked regularly if you have high blood pressure, high cholesterol, have a family history of diabetes or if you are overweight.  Screening for Colon Cancer- Colonoscopy starting at age 59.  Screening may begin sooner depending on your family history and other health conditions. Follow up colonoscopy as directed by your Gastroenterologist.  Screening for Prostate Cancer- Both blood work (PSA) and a rectal exam help screen for Prostate Cancer.  Screening begins at age 55 with African-American men and at age 2 with Caucasian men.  Screening may begin sooner depending on your family history.  Take these medicines  Flu shot- Every fall.  Tetanus- Every 10 years.  Shingrix - 2 injections to prevent Shingles.  Pneumonia shot- Once after the age of 34; if you are younger than 8, ask your healthcare provider if you need a Pneumonia shot.  Take these steps  Don't smoke- If you do smoke, talk to your doctor about quitting.  For tips on how to quit, go to www.smokefree.gov or call 1-800-QUIT-NOW.  Be physically active- Exercise 5 days a week for at least 30 minutes.  If you are not already physically active start slow and gradually  work up to 30 minutes of moderate physical activity.  Examples of moderate activity include walking briskly, mowing the yard, dancing, swimming, bicycling, etc.  Eat a healthy diet- Eat a variety of healthy food such as fruits, vegetables, low fat milk, low fat cheese, yogurt, lean meant, poultry, fish, beans, tofu, etc. For more information go to www.thenutritionsource.org  Drink alcohol in moderation- Limit alcohol intake to less than two drinks a day. Never drink and drive.  Dentist- Brush and floss twice daily; visit your dentist twice a year.  Depression- Your emotional health is as important as your physical health. If you're feeling down, or losing interest in things you would normally enjoy please talk to your healthcare provider.  Eye exam- Visit your eye doctor every year.  Safe sex- If you may be exposed to a sexually transmitted infection, use a condom.  Seat belts- Seat belts can save your life; always wear one.  Smoke/Carbon Monoxide detectors- These detectors need to be installed on the appropriate level of your home.  Replace batteries at least once a year.  Skin cancer- When out in the sun, cover up and use sunscreen 15 SPF or higher.  Violence- If anyone is threatening you, please tell your healthcare provider.  Living Will/ Health care power of attorney- Speak with your healthcare provider and family.   If you have lab work done today you will be contacted with your lab results within the next 2 weeks.  If you have not heard from  [place] then please contact us. The fastest way to get your results is to register for My Chart.   IF you received an x-ray today, you will receive an invoice from [facility]. Please contact [facility] at [phone] with questions or concerns regarding your invoice.   IF you received labwork today, you will receive an invoice from [facility] [place]. Please contact [facility] at [phone] with questions or concerns regarding your  invoice.   Our billing staff will not be able to assist you with questions regarding bills from these companies.  You will be contacted with the lab results as soon as they are available. The fastest way to get your results is to activate your My Chart account. Instructions are located on the last page of this paperwork. If you have not heard from [place] regarding the results in 2 weeks, please contact this office.

## 2020-03-30 NOTE — Progress Notes (Signed)
Subjective:  Patient ID: Alexander George, male    DOB: 02/08/1956  Age: 64 y.o. MRN: [id number]  CC:  Chief Complaint  Patient presents with  . Annual Exam    Pt reports he feels well with no complaints.    HPI [name] presents for   Annual physical exam Recently evaluated by cardiology October 14.  Plan to Zio patch for palpitations.  Had coronary CTA with normal coronary arteries for chest pain, suspected noncardiac chest pain.  History of OSA on CPAP Wearing nightly.  Working well.   History of low testosterone 8-9 years ago - stopped because of concern with risks. Would not take supplement now if low.   Cancer screening Colonoscopy 01/20/2018, repeat 3 years. The natural history of prostate cancer and ongoing controversy regarding screening and potential treatment outcomes of prostate cancer has been discussed with the patient. The meaning of a false positive PSA and a false negative PSA has been discussed. He indicates understanding of the limitations of this screening test and wishes to proceed with screening PSA testing. Declines DRE at his time.   Lab Results  Component Value Date   PSA1 WILL FOLLOW 03/30/2020   PSA1 0.9 02/02/2019   PSA1 0.7 01/22/2018   PSA 0.56 09/30/2014    Immunization History  Administered Date(s) Administered  . Influenza,inj,Quad PF,6+ Mos 04/02/2013, 04/27/2015, 02/02/2019  . PFIZER SARS-COV-2 Vaccination 07/13/2019, 08/20/2019  . Tdap 01/22/2018  covid vaccine - March, April.  Shingles vaccine - not had - thinking about it.   Depression screen [facility] 2/9 03/30/2020 10/30/2019 10/01/2019 09/17/2019 02/02/2019  Decreased Interest 0 0 0 0 0  Down, Depressed, Hopeless 0 0 0 0 0  PHQ - 2 Score 0 0 0 0 0    Hearing Screening   125Hz  250Hz  500Hz  1000Hz  2000Hz  3000Hz  4000Hz  6000Hz  8000Hz   Right ear:           Left ear:             Visual Acuity Screening   Right eye Left eye Both eyes  Without correction: 20/30-2 20/30 20/20  With  correction:     appt next week with optho.   Dental: Every 6 months.   Exercise: Walking for exercise on occasion.    History Patient Active Problem List   Diagnosis Date Noted  . Degeneration of lumbar or lumbosacral intervertebral disc 07/30/2013  . OSA (obstructive sleep apnea) 03/16/2008   Past Medical History:  Diagnosis Date  . Heart murmur   . Seizures ([place]) 40 years ago    none since then per pt  . Sleep apnea    CPAP    Past Surgical History:  Procedure Laterality Date  . COLONOSCOPY  07/20/2005   Dr.Kaplan=normal exam   . HERNIA REPAIR    . KNEE SURGERY Left    6-7 years ago   No Known Allergies Prior to Admission medications   Not on File   Social History   Socioeconomic History  . Marital status: Married    Spouse name: Not on file  . Number of children: Not on file  . Years of education: Not on file  . Highest education level: Not on file  Occupational History  . Not on file  Tobacco Use  . Smoking status: Never Smoker  . Smokeless tobacco: Never Used  Vaping Use  . Vaping Use: Never used  Substance and Sexual Activity  . Alcohol use: No  . Drug use: No  . Sexual activity:  Not on file  Other Topics Concern  . Not on file  Social History Narrative   Right handed   Lives with wife in a two story home.   Social Determinants of Health   Financial Resource Strain:   . Difficulty of Paying Living Expenses: Not on file  Food Insecurity:   . Worried About Charity fundraiser in the Last Year: Not on file  . Ran Out of Food in the Last Year: Not on file  Transportation Needs:   . Lack of Transportation (Medical): Not on file  . Lack of Transportation (Non-Medical): Not on file  Physical Activity:   . Days of Exercise per Week: Not on file  . Minutes of Exercise per Session: Not on file  Stress:   . Feeling of Stress : Not on file  Social Connections:   . Frequency of Communication with Friends and Family: Not on file  . Frequency of  Social Gatherings with Friends and Family: Not on file  . Attends Religious Services: Not on file  . Active Member of Clubs or Organizations: Not on file  . Attends Archivist Meetings: Not on file  . Marital Status: Not on file  Intimate Partner Violence:   . Fear of Current or Ex-Partner: Not on file  . Emotionally Abused: Not on file  . Physically Abused: Not on file  . Sexually Abused: Not on file    Review of Systems 13 point review of systems per patient health survey noted.  Negative other than as indicated above or in HPI.    Objective:   Vitals:   03/30/20 1046  BP: 117/76  Pulse: 64  Temp: 98.4 F (36.9 C)  TempSrc: Temporal  SpO2: 98%  Weight: 164 lb (74.4 kg)  Height: 5\' 7"  (1.702 m)     Physical Exam Vitals reviewed.  Constitutional:      Appearance: He is well-developed.  HENT:     Head: Normocephalic and atraumatic.     Right Ear: External ear normal.     Left Ear: External ear normal.  Eyes:     Conjunctiva/sclera: Conjunctivae normal.     Pupils: Pupils are equal, round, and reactive to light.  Neck:     Thyroid: No thyromegaly.  Cardiovascular:     Rate and Rhythm: Normal rate and regular rhythm.     Heart sounds: Normal heart sounds.  Pulmonary:     Effort: Pulmonary effort is normal. No respiratory distress.     Breath sounds: Normal breath sounds. No wheezing.  Abdominal:     General: There is no distension.     Palpations: Abdomen is soft.     Tenderness: There is no abdominal tenderness.  Musculoskeletal:        General: No tenderness. Normal range of motion.     Cervical back: Normal range of motion and neck supple.  Lymphadenopathy:     Cervical: No cervical adenopathy.  Skin:    General: Skin is warm and dry.  Neurological:     Mental Status: He is alert and oriented to person, place, and time.     Deep Tendon Reflexes: Reflexes are normal and symmetric.  Psychiatric:        Behavior: Behavior normal.      Assessment & Plan:  Alexander George is a 64 y.o. male . Annual physical exam  - -anticipatory guidance as below in AVS, screening labs above. Health maintenance items as above in HPI discussed/recommended as applicable.  Screening, anemia, deficiency, iron - Plan: CBC  Screening for diabetes mellitus - Plan: Comprehensive metabolic panel  Screening for hyperlipidemia - Plan: Comprehensive metabolic panel, Lipid panel  Screening for prostate cancer - Plan: PSA   No orders of the defined types were placed in this encounter.  Patient Instructions    Low intensity exercise (if ok with cardiology) most days per week, goal of 150 minutes per week.   I do recommend shingrix vaccine - that can be given here or pharmacy.   I am happy to discuss testosterone and replacement further. If we do check that it should be between 8 and 10am.   Thanks for coming in today.    Keeping you healthy  Get these tests  Blood pressure- Have your blood pressure checked once a year by your healthcare provider.  Normal blood pressure is 120/80  Weight- Have your body mass index (BMI) calculated to screen for obesity.  BMI is a measure of body fat based on height and weight. You can also calculate your own BMI at ViewBanking.si.  Cholesterol- Have your cholesterol checked every year.  Diabetes- Have your blood sugar checked regularly if you have high blood pressure, high cholesterol, have a family history of diabetes or if you are overweight.  Screening for Colon Cancer- Colonoscopy starting at age 64.  Screening may begin sooner depending on your family history and other health conditions. Follow up colonoscopy as directed by your Gastroenterologist.  Screening for Prostate Cancer- Both blood work (PSA) and a rectal exam help screen for Prostate Cancer.  Screening begins at age 5 with African-American men and at age 56 with Caucasian men.  Screening may begin sooner depending on  your family history.  Take these medicines  Flu shot- Every fall.  Tetanus- Every 10 years.  Shingrix - 2 injections to prevent Shingles.  Pneumonia shot- Once after the age of 55; if you are younger than 51, ask your healthcare provider if you need a Pneumonia shot.  Take these steps  Don't smoke- If you do smoke, talk to your doctor about quitting.  For tips on how to quit, go to www.smokefree.gov or call 1-800-QUIT-NOW.  Be physically active- Exercise 5 days a week for at least 30 minutes.  If you are not already physically active start slow and gradually work up to 30 minutes of moderate physical activity.  Examples of moderate activity include walking briskly, mowing the yard, dancing, swimming, bicycling, etc.  Eat a healthy diet- Eat a variety of healthy food such as fruits, vegetables, low fat milk, low fat cheese, yogurt, lean meant, poultry, fish, beans, tofu, etc. For more information go to www.thenutritionsource.org  Drink alcohol in moderation- Limit alcohol intake to less than two drinks a day. Never drink and drive.  Dentist- Brush and floss twice daily; visit your dentist twice a year.  Depression- Your emotional health is as important as your physical health. If you're feeling down, or losing interest in things you would normally enjoy please talk to your healthcare provider.  Eye exam- Visit your eye doctor every year.  Safe sex- If you may be exposed to a sexually transmitted infection, use a condom.  Seat belts- Seat belts can save your life; always wear one.  Smoke/Carbon Monoxide detectors- These detectors need to be installed on the appropriate level of your home.  Replace batteries at least once a year.  Skin cancer- When out in the sun, cover up and use sunscreen 15 SPF or higher.  Violence- If anyone is threatening you, please tell your healthcare provider.  Living Will/ Health care power of attorney- Speak with your healthcare provider and  family.   If you have lab work done today you will be contacted with your lab results within the next 2 weeks.  If you have not heard from [place] then please contact us. The fastest way to get your results is to register for My Chart.   IF you received an x-ray today, you will receive an invoice from [facility]. Please contact [facility] at [phone] with questions or concerns regarding your invoice.   IF you received labwork today, you will receive an invoice from [facility]. Please contact [facility] at [phone] with questions or concerns regarding your invoice.   Our billing staff will not be able to assist you with questions regarding bills from these companies.  You will be contacted with the lab results as soon as they are available. The fastest way to get your results is to activate your My Chart account. Instructions are located on the last page of this paperwork. If you have not heard from [place] regarding the results in 2 weeks, please contact this office.         Signed, Merri Ray, MD [facility]

## 2020-03-31 LAB — COMPREHENSIVE METABOLIC PANEL
ALT: 13 IU/L (ref 0–44)
AST: 17 IU/L (ref 0–40)
Albumin/Globulin Ratio: 1.8 (ref 1.2–2.2)
Albumin: 4.8 g/dL (ref 3.8–4.8)
Alkaline Phosphatase: 48 IU/L (ref 44–121)
BUN/Creatinine Ratio: 19 (ref 10–24)
BUN: 19 mg/dL (ref 8–27)
Bilirubin Total: 0.5 mg/dL (ref 0.0–1.2)
CO2: 24 mmol/L (ref 20–29)
Calcium: 9 mg/dL (ref 8.6–10.2)
Chloride: 102 mmol/L (ref 96–106)
Creatinine, Ser: 0.98 mg/dL (ref 0.76–1.27)
GFR calc Af Amer: 94 mL/min/{1.73_m2} (ref >59)
GFR calc non Af Amer: 82 mL/min/{1.73_m2} (ref >59)
Globulin, Total: 2.6 g/dL (ref 1.5–4.5)
Glucose: 109 mg/dL — ABNORMAL HIGH (ref 65–99)
Potassium: 4.3 mmol/L (ref 3.5–5.2)
Sodium: 139 mmol/L (ref 134–144)
Total Protein: 7.4 g/dL (ref 6.0–8.5)

## 2020-03-31 LAB — LIPID PANEL
Chol/HDL Ratio: 2.4 ratio (ref 0.0–5.0)
Cholesterol, Total: 179 mg/dL (ref 100–199)
HDL: 76 mg/dL (ref >39)
LDL Chol Calc (NIH): 92 mg/dL (ref 0–99)
Triglycerides: 56 mg/dL (ref 0–149)
VLDL Cholesterol Cal: 11 mg/dL (ref 5–40)

## 2020-03-31 LAB — CBC
Hematocrit: 42.7 % (ref 37.5–51.0)
Hemoglobin: 14.5 g/dL (ref 13.0–17.7)
MCH: 29.1 pg (ref 26.6–33.0)
MCHC: 34 g/dL (ref 31.5–35.7)
MCV: 86 fL (ref 79–97)
Platelets: 193 10*3/uL (ref 150–450)
RBC: 4.98 x10E6/uL (ref 4.14–5.80)
RDW: 13.4 % (ref 11.6–15.4)
WBC: 4.2 10*3/uL (ref 3.4–10.8)

## 2020-03-31 LAB — PSA: Prostate Specific Ag, Serum: 1 ng/mL (ref 0.0–4.0)

## 2020-04-01 ENCOUNTER — Ambulatory Visit (INDEPENDENT_AMBULATORY_CARE_PROVIDER_SITE_OTHER): Payer: BC Managed Care – PPO

## 2020-04-01 DIAGNOSIS — R002 Palpitations: Secondary | ICD-10-CM | POA: Diagnosis not present

## 2020-06-02 ENCOUNTER — Encounter: Payer: Self-pay | Admitting: *Deleted

## 2020-06-05 NOTE — Progress Notes (Deleted)
Cardiology Office Note:    Date:  06/05/2020   ID:  Alexander George, DOB 02-17-1956, MRN [id number]  PCP:  Wendie Agreste, MD  Cardiologist:  Donato Heinz, MD  Electrophysiologist:  None   Referring MD: Wendie Agreste, MD   No chief complaint on file.   History of Present Illness:    Alexander George is a 64 y.o. male with a hx of seizures, OSA who presents for follow-up.  He was referred by Dr.Sagardia for an evaluation of chest pain, initially seen on 07/13/2019.  Started having chest pain 10 years ago, has been chronic issue.  However, for 2-3 weeks prior to initial appointment, has occurred every 2-3 days.  Reports left-sided pain, describes as pinching.  Lasts for about 1 minute and resolves.  Can occur while resting or when walking.  He reports that he walks 20 to 30 minutes/day and has not noted relationship with chest pain and exertion.  Denies any dyspnea,  lightheadedness, syncope, or LE edema.  Reports palpitations occur rarely (every 4-5 months).  Feels like heart is racing,  lasts few seconds and resolves.  Never smoked.  No heart disease in immediate family.    Coronary CTA on 08/25/2019 showed normal coronary arteries but moderate right atrial/right ventricular dilatation.  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  Cardiac MRI on 03/03/2020 showed QP/QS 1.1, suggesting no significant shunt, mild RV dilatation with normal systolic function (EF 87%), normal LV size and systolic function (EF 56%), mild TR/PI/AI.  Patient declined contrast administration.  Zio patch x14 days on 04/25/2020 showed 13 episodes of SVT, longest lasting 20 beats.  Since last clinic visit,   he reports that he has been doing okay.  Reports chest pain has improved.  Denies any dyspnea.  Does  report he has been having palpitations.  Previously was occurring once every few months, but states recently has been happening 3-4 times per week.  Typically lasts for a few seconds and resolves.  Denies any lightheadedness or syncope.   Past Medical History:  Diagnosis Date  . Heart murmur   . Seizures ([place]) 40 years ago    none since then per pt  . Sleep apnea    CPAP     Past Surgical History:  Procedure Laterality Date  . COLONOSCOPY  07/20/2005   Dr.Kaplan=normal exam   . HERNIA REPAIR    . KNEE SURGERY Left    6-7 years ago    Current Medications: No outpatient medications have been marked as taking for the 06/07/20 encounter (Appointment) with Donato Heinz, MD.     Allergies:   Patient has no known allergies.   Social History   Socioeconomic History  . Marital status: Married    Spouse name: Not on file  . Number of children: Not on file  . Years of education: Not on file  . Highest education level: Not on file  Occupational History  . Not on file  Tobacco Use  . Smoking status: Never Smoker  . Smokeless tobacco: Never Used  Vaping Use  . Vaping Use: Never used  Substance and Sexual Activity  . Alcohol use: No  . Drug use: No  . Sexual activity: Not on file  Other Topics Concern  . Not on file  Social History Narrative   Right handed  Lives with wife in a two story home.   Social Determinants of Health   Financial Resource Strain: Not on file  Food Insecurity: Not on file  Transportation Needs: Not on file  Physical Activity: Not on file  Stress: Not on file  Social Connections: Not on file     Family History: The patient's family history includes Cancer in his sister; Diabetes in his father; Stomach cancer in his sister. There is no history of Colon cancer, Esophageal cancer, or Rectal cancer.  ROS:   Please see the history of present illness.     All other systems reviewed and are negative.  EKGs/Labs/Other Studies Reviewed:     The following studies were reviewed today:   EKG:  EKG is not ordered today.  The ekg ordered most recently demonstrates normal sinus rhythm, rate 67, no ST/T abnormalities  TTE 09/11/2019: 1. Left ventricular ejection fraction, by estimation, is 55%. The left  ventricle has normal function. The left ventricle has no regional wall  motion abnormalities. There is mild asymmetric left ventricular  hypertrophy of the posterior-lateral segment.  Left ventricular diastolic parameters are consistent with Grade I  diastolic dysfunction (impaired relaxation).  2. Right ventricular systolic function is normal. The right ventricular  size is moderately enlarged. There is normal pulmonary artery systolic  pressure. The estimated right ventricular systolic pressure is 0000000 mmHg.  3. 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  The mitral valve is abnormal. Trivial mitral valve regurgitation. No  evidence of mitral stenosis.  4. Tricuspid valve regurgitation is mild to moderate.  5. The aortic valve is tricuspid. Aortic valve regurgitation is not  visualized. No aortic stenosis is present.  6. There is dilatation of the ascending aorta.  7. The inferior vena cava is normal in size with greater than 50%  respiratory variability, suggesting right atrial pressure of 3 mmHg.  Coronary CTA 08/25/19: 1. Coronary calcium score of 0. 2. No evidence of CAD. 3. Moderate right atrial and right ventricular dilatation  CAD-RADS 0. No evidence of CAD (0%). Consider non-atherosclerotic causes of chest pain.  IMPRESSION: Mild cardiomegaly.  No acute extra cardiac abnormality.   Recent Labs: 03/30/2020: ALT 13; BUN 19; Creatinine, Ser 0.98; Hemoglobin 14.5; Platelets 193; Potassium 4.3; Sodium 139  Recent Lipid Panel    Component Value Date/Time   CHOL 179 03/30/2020 1126   TRIG 56 03/30/2020 1126   HDL 76 03/30/2020 1126   CHOLHDL 2.4 03/30/2020 1126   CHOLHDL 2.5  04/27/2015 1354   VLDL 18 04/27/2015 1354   LDLCALC 92 03/30/2020 1126    Physical Exam:    VS:  There were no vitals taken for this visit.    Wt Readings from Last 3 Encounters:  03/30/20 164 lb (74.4 kg)  03/24/20 163 lb (73.9 kg)  12/11/19 166 lb (75.3 kg)     GEN:  Well nourished, well developed in no acute distress HEENT: Normal NECK: No JVD LYMPHATICS: No lymphadenopathy CARDIAC: RRR, no murmurs RESPIRATORY:  Clear to auscultation without rales, wheezing or rhonchi  ABDOMEN: Soft, non-tender, non-distended MUSCULOSKELETAL:  No edema; No deformity  SKIN: Warm and dry NEUROLOGIC:  Alert and oriented x 3 PSYCHIATRIC:  Normal affect   ASSESSMENT:    No diagnosis found. PLAN:     Chest pain: Atypical in description.  Coronary CTA shows normal coronary arteries.  Suspect noncardiac chest pain, no further cardiac work-up recommended  RV dilatation: Moderate RV dilatation on TTE.  Unclear cause, concerning  for shunt.  Cardiac MRI on 03/03/2020 showed QP/QS 1.1, suggesting no significant shunt, mild RV dilatation with normal systolic function (EF [id number]), normal LV size and systolic function (EF 0000000), mild TR/PI/AI.    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  Palpitations: Description concerning for arrhythmia.  Zio patch x14 days on 04/25/2020 showed 13 episodes of SVT, longest lasting 20 beats.  OSA: On CPAP.  Encourage compliance  RTC in ***   Medication Adjustments/Labs and Tests Ordered: Current medicines are reviewed at length with the patient today.  Concerns regarding medicines are outlined above.  No orders of the defined types were placed in this encounter.  No orders of the defined types were placed in this encounter.   There are no Patient Instructions on file for this visit.   Signed, Donato Heinz, MD  06/05/2020 1:12 PM    [facility]

## 2020-06-07 ENCOUNTER — Other Ambulatory Visit: Payer: Self-pay

## 2020-06-07 ENCOUNTER — Ambulatory Visit: Payer: BC Managed Care – PPO | Admitting: Cardiology

## 2020-06-09 ENCOUNTER — Other Ambulatory Visit: Payer: Self-pay

## 2020-06-09 ENCOUNTER — Ambulatory Visit (INDEPENDENT_AMBULATORY_CARE_PROVIDER_SITE_OTHER): Payer: BC Managed Care – PPO | Admitting: Cardiology

## 2020-06-09 ENCOUNTER — Encounter: Payer: Self-pay | Admitting: Cardiology

## 2020-06-09 VITALS — BP 110/72 | HR 68 | Ht 66.0 in | Wt 165.2 lb

## 2020-06-09 DIAGNOSIS — I517 Cardiomegaly: Secondary | ICD-10-CM

## 2020-06-09 DIAGNOSIS — R002 Palpitations: Secondary | ICD-10-CM | POA: Diagnosis not present

## 2020-06-09 DIAGNOSIS — R079 Chest pain, unspecified: Secondary | ICD-10-CM | POA: Diagnosis not present

## 2020-06-09 NOTE — Progress Notes (Signed)
Cardiology Office Note:    Date:  06/09/2020   ID:  Alexander George, DOB 05/09/1956, MRN [id number]  PCP:  Shade Flood, MD  Cardiologist:  Little Ishikawa, MD  Electrophysiologist:  None   Referring MD: Shade Flood, MD   Chief Complaint  Patient presents with  . Chest Pain    History of Present Illness:    Alexander George is a 64 y.o. male with a hx of seizures, OSA who presents for follow-up.  He was referred by Dr.Sagardia for an evaluation of chest pain, initially seen on 07/13/2019.  Started having chest pain 10 years ago, has been chronic issue.  However, for 2-3 weeks prior to initial appointment, has occurred every 2-3 days.  Reports left-sided pain, describes as pinching.  Lasts for about 1 minute and resolves.  Can occur while resting or when walking.  He reports that he walks 20 to 30 minutes/day and has not noted relationship with chest pain and exertion.  Denies any dyspnea,  lightheadedness, syncope, or LE edema.  Reports palpitations occur rarely (every 4-5 months).  Feels like heart is racing,  lasts few seconds and resolves.  Never smoked.  No heart disease in immediate family.    Coronary CTA on 08/25/2019 showed normal coronary arteries but moderate right atrial/right ventricular dilatation.  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  Cardiac MRI on 03/03/2020 showed QP/QS 1.1, suggesting no significant shunt, mild RV dilatation with normal systolic function (EF 55%), normal LV size and systolic function (EF 56%), mild TR/PI/AI.  Patient declined contrast administration.  Zio patch x14 days on 04/25/2020 showed 13 episodes of SVT, longest lasting 20 beats.  Since last clinic visit, he reports that he continues to have chest pain.  Occurs multiple times per day,  last for few seconds and resolves.  Describes as dull pain but can also be pressure.  No clear cause, can happen at any time.    Past Medical History:  Diagnosis Date  . Heart murmur   . Seizures (HCC) 40 years ago    none since then per pt  . Sleep apnea    CPAP     Past Surgical History:  Procedure Laterality Date  . COLONOSCOPY  07/20/2005   Dr.Kaplan=normal exam   . HERNIA REPAIR    . KNEE SURGERY Left    6-7 years ago    Current Medications: No outpatient medications have been marked as taking for the 06/09/20 encounter (Office Visit) with Little Ishikawa, MD.     Allergies:   Patient has no known allergies.   Social History   Socioeconomic History  . Marital status: Married    Spouse name: Not on file  . Number of children: Not on file  . Years of education: Not on file  . Highest education level: Not on file  Occupational History  . Not on file  Tobacco Use  . Smoking status: Never Smoker  . Smokeless tobacco: Never Used  Vaping Use  . Vaping Use: Never used  Substance and Sexual Activity  . Alcohol use: No  . Drug use: No  . Sexual activity: Not on file  Other Topics Concern  . Not on file  Social History Narrative   Right handed   Lives with wife in a two story home.   Social  Determinants of Health   Financial Resource Strain: Not on file  Food Insecurity: Not on file  Transportation Needs: Not on file  Physical Activity: Not on file  Stress: Not on file  Social Connections: Not on file     Family History: The patient's family history includes Cancer in his sister; Diabetes in his father; Stomach cancer in his sister. There is no history of Colon cancer, Esophageal cancer, or Rectal cancer.  ROS:   Please see the history of present illness.     All other systems reviewed and are negative.  EKGs/Labs/Other Studies Reviewed:    The following studies were reviewed today:   EKG:  EKG is ordered today.  The ekg ordered demonstrates  normal sinus rhythm, rate 68, no ST/T abnormalities  TTE 09/11/2019: 1. Left ventricular ejection fraction, by estimation, is 55%. The left  ventricle has normal function. The left ventricle has no regional wall  motion abnormalities. There is mild asymmetric left ventricular  hypertrophy of the posterior-lateral segment.  Left ventricular diastolic parameters are consistent with Grade I  diastolic dysfunction (impaired relaxation).  2. Right ventricular systolic function is normal. The right ventricular  size is moderately enlarged. There is normal pulmonary artery systolic  pressure. The estimated right ventricular systolic pressure is 0000000 mmHg.  3. 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  The mitral valve is abnormal. Trivial mitral valve regurgitation. No  evidence of mitral stenosis.  4. Tricuspid valve regurgitation is mild to moderate.  5. The aortic valve is tricuspid. Aortic valve regurgitation is not  visualized. No aortic stenosis is present.  6. There is dilatation of the ascending aorta.  7. The inferior vena cava is normal in size with greater than 50%  respiratory variability, suggesting right atrial pressure of 3 mmHg.  Coronary CTA 08/25/19: 1. Coronary calcium score of 0. 2. No evidence of CAD. 3. Moderate right atrial and right ventricular dilatation  CAD-RADS 0. No evidence of CAD (0%). Consider non-atherosclerotic causes of chest pain.  IMPRESSION: Mild cardiomegaly.  No acute extra cardiac abnormality.   Recent Labs: 03/30/2020: ALT 13; BUN 19; Creatinine, Ser 0.98; Hemoglobin 14.5; Platelets 193; Potassium 4.3; Sodium 139  Recent Lipid Panel    Component Value Date/Time   CHOL 179 03/30/2020 1126   TRIG 56 03/30/2020 1126   HDL 76 03/30/2020 1126   CHOLHDL 2.4 03/30/2020 1126   CHOLHDL 2.5 04/27/2015 1354   VLDL 18 04/27/2015 1354   LDLCALC 92 03/30/2020 1126    Physical Exam:    VS:  BP 110/72 (BP Location:  Left Arm, Patient Position: Sitting)   Pulse 68   Ht 5\' 6"  (1.676 m)   Wt 165 lb 3.2 oz (74.9 kg)   SpO2 95%   BMI 26.66 kg/m     Wt Readings from Last 3 Encounters:  06/09/20 165 lb 3.2 oz (74.9 kg)  03/30/20 164 lb (74.4 kg)  03/24/20 163 lb (73.9 kg)     GEN:  Well nourished, well developed in no acute distress HEENT: Normal NECK: No JVD LYMPHATICS: No lymphadenopathy CARDIAC: RRR, no murmurs RESPIRATORY:  Clear to auscultation without rales, wheezing or rhonchi  ABDOMEN: Soft, non-tender, non-distended MUSCULOSKELETAL:  No edema; No deformity  SKIN: Warm and dry NEUROLOGIC:  Alert and oriented x 3 PSYCHIATRIC:  Normal affect   ASSESSMENT:    1. Chest pain of uncertain etiology   2. Right ventricular dilation   3. Palpitations    PLAN:  Chest pain: Atypical in description.  Coronary CTA shows normal coronary arteries.  Suspect noncardiac chest pain, no further cardiac work-up recommended  RV dilatation: Moderate RV dilatation on TTE.  Unclear cause, concerning for shunt.  Cardiac MRI on 03/03/2020 showed QP/QS 1.1, suggesting no significant shunt, mild RV dilatation with normal systolic function (EF [id number]), normal LV size and systolic function (EF 0000000), mild TR/PI/AI.    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  Palpitations: Description concerning for arrhythmia.  Zio patch x14 days on 04/25/2020 showed 13 episodes of SVT, longest lasting 20 beats.  Given short duration and did not appear symptomatic on monitor, no further treatment indicated at this time  OSA: On CPAP.  Encourage compliance  RTC in 6 months   Medication Adjustments/Labs and Tests Ordered: Current medicines are reviewed at length with the patient today.  Concerns regarding medicines are outlined above.  Orders Placed This Encounter  Procedures  . EKG 12-Lead   No orders of the defined types were placed in this encounter.   Patient Instructions  Medication  Instructions:  Your physician recommends that you continue on your current medications as directed. Please refer to the Current Medication list given to you today.  *If you need a refill on your cardiac medications before your next appointment, please call your pharmacy*  Follow-Up: At [facility], you and your health needs are our priority.  As part of our continuing mission to provide you with exceptional heart care, we have created designated Provider Care Teams.  These Care Teams include your primary Cardiologist (physician) and Advanced Practice Providers (APPs -  Physician Assistants and Nurse Practitioners) who all work together to provide you with the care you need, when you need it.  We recommend signing up for the patient portal called "MyChart".  Sign up information is provided on this After Visit Summary.  MyChart is used to connect with patients for Virtual Visits (Telemedicine).  Patients are able to view lab/test results, encounter notes, upcoming appointments, etc.  Non-urgent messages can be sent to your provider as well.   To learn more about what you can do with MyChart, go to [email or web address].    Your next appointment:   6 month(s)  The format for your next appointment:   In Person  Provider:   Oswaldo Milian, MD        Signed, Donato Heinz, MD  06/09/2020 5:46 PM    [place]

## 2020-06-09 NOTE — Patient Instructions (Signed)

## 2020-06-20 ENCOUNTER — Telehealth: Payer: Self-pay | Admitting: Pulmonary Disease

## 2020-06-20 NOTE — Telephone Encounter (Signed)
Spoke with patient. He stated that per his wife, he has been making strange sounds in his sleep while using his cpap machine. This has been going on for the past week. His wife is concerned that even with him using the cpap machine, he is still gasping for air while sleeping. He wanted to see RA at the next available appt. Was able to get him scheduled to see RA tomorrow at 215pm.   Nothing further needed.

## 2020-06-20 NOTE — Telephone Encounter (Signed)
ATC patient unable to reach LM to call back office (x1)  

## 2020-06-21 ENCOUNTER — Encounter: Payer: Self-pay | Admitting: Pulmonary Disease

## 2020-06-21 ENCOUNTER — Ambulatory Visit: Payer: BC Managed Care – PPO | Admitting: Pulmonary Disease

## 2020-06-21 ENCOUNTER — Other Ambulatory Visit: Payer: Self-pay

## 2020-06-21 DIAGNOSIS — G4733 Obstructive sleep apnea (adult) (pediatric): Secondary | ICD-10-CM | POA: Diagnosis not present

## 2020-06-21 NOTE — Addendum Note (Signed)
Addended by: Merrilee Seashore on: 06/21/2020 03:21 PM   Modules accepted: Orders

## 2020-06-21 NOTE — Assessment & Plan Note (Signed)
We reviewed CPAP download on auto settings 5 to 10 cm which shows no residual events 0.3/hour, great compliance more than 6 hours every night and no leakage I showed him the data from 2 nights ago and reassured him that no significant apneas are present We will increase auto CPAP settings to 5 to 12 cm in case he needs additional pressure  He was very compliant, he is settled down with nasal mask and CPAP certainly helped improve his daytime somnolence and fatigue

## 2020-06-21 NOTE — Progress Notes (Signed)
   Subjective:    Patient ID: Ballard Russell, male    DOB: 1956-01-26, 65 y.o.   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 Follow-up    About 2 nights ago, pt wife told patient that you had stopped breathing and body was shivering and patient was making noises   Overall he has done well with nasal mask this time and has tolerated pressure very well, very compliant by report.  Daytime somnolence and fatigue is improved. 2 nights ago his wife noted that he was stopping breathing and she noticed him to and when he moved to his side event seems to subside.  He feels that his wife may have been more observant because she was not working that night, generally she works nights and has not really observed him sleeping.  He  is worried that he may be having more events Denies excessive sleep pressure or gasping or choking episodes    His son and his nephews have also been diagnosed with OSA   Significant tests/ events reviewed  1/2003NPSGRDI was 21 with a REM RDI of 57. His oxygen nadir was 76%.  HST 08/2019 severe OSA, AHI 30/hour  coronary CT showed RA/RV dilatation. 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 Cardiac MRI on 03/03/2020 showed QP/QS 1.1, suggesting no significant shunt, mild RV dilatation with normal systolic function (EF 53%), normal LV size and systolic function (EF 61%), mild TR/PI/AI.  Patient declined contrast administration.   Zio patch x14 days on 04/25/2020 showed 13 episodes of SVT, longest lasting 20 beats.  Review of Systems Patient denies significant dyspnea,cough, hemoptysis,  chest pain, palpitations, pedal edema, orthopnea, paroxysmal nocturnal dyspnea, lightheadedness, nausea, vomiting, abdominal or  leg pains      Objective:   Physical Exam  Gen. Pleasant, thin,well-nourished, in no distress ENT - no thrush, no  pallor/icterus,no post nasal drip Neck: No JVD, no thyromegaly, no carotid bruits Lungs: no use of accessory muscles, no dullness to percussion, clear without rales or rhonchi  Cardiovascular: Rhythm regular, heart sounds  normal, no murmurs or gallops, no peripheral edema Musculoskeletal: No deformities, no cyanosis or clubbing        Assessment & Plan:

## 2020-06-21 NOTE — Patient Instructions (Signed)
  Increase auto CPAP settings 5 to 12 cm

## 2020-08-04 ENCOUNTER — Ambulatory Visit: Payer: Self-pay | Admitting: Pulmonary Disease

## 2020-08-31 ENCOUNTER — Ambulatory Visit: Payer: BC Managed Care – PPO | Attending: Critical Care Medicine

## 2020-08-31 DIAGNOSIS — Z20822 Contact with and (suspected) exposure to covid-19: Secondary | ICD-10-CM

## 2020-09-01 ENCOUNTER — Other Ambulatory Visit: Payer: BC Managed Care – PPO

## 2020-09-01 LAB — NOVEL CORONAVIRUS, NAA: SARS-CoV-2, NAA: NOT DETECTED

## 2020-09-01 LAB — SARS-COV-2, NAA 2 DAY TAT

## 2020-12-21 ENCOUNTER — Ambulatory Visit: Payer: BC Managed Care – PPO | Admitting: Family Medicine

## 2020-12-28 ENCOUNTER — Other Ambulatory Visit: Payer: Self-pay

## 2020-12-28 ENCOUNTER — Encounter: Payer: Self-pay | Admitting: Family Medicine

## 2020-12-28 ENCOUNTER — Ambulatory Visit: Payer: BC Managed Care – PPO | Admitting: Family Medicine

## 2020-12-28 VITALS — BP 126/70 | HR 61 | Temp 98.4°F | Resp 16 | Ht 68.0 in | Wt 161.8 lb

## 2020-12-28 DIAGNOSIS — M25561 Pain in right knee: Secondary | ICD-10-CM | POA: Diagnosis not present

## 2020-12-28 DIAGNOSIS — M25562 Pain in left knee: Secondary | ICD-10-CM | POA: Diagnosis not present

## 2020-12-28 NOTE — Progress Notes (Signed)
Subjective:  Patient ID: Alexander George, male    DOB: 14-Aug-1955  Age: 65 y.o. MRN: [id number]  CC:  Chief Complaint  Patient presents with   Knee Pain    Pt reports knee pain in RT for a long period of time, Lt knee more recent but still persistent pain, causes pain and weakness with climbing stairs or running     HPI Alexander George presents for   Bilateral knee pain: Longstanding symptoms,on right - notes more with running. Initial 10 mins ok, pain after 11 min. Front of kneecap bilat, with some pain medial R knee. Interferes with climbing stairs or running - sore to climb stairs, some soreness after prolonged sitting - minimal.  Attempted treatments: None recent.  Meloxicam, hinged knee brace - last year - affected running. Some relief with Mobic. Did not see ortho.  Previous imaging:  Right knee x-ray September 17, 2019, patellofemoral pain at that time.  Small knee joint effusion otherwise normal radiographs. Left knee December 2016, negative.  Previous surgery/injection: ? Shaving of cartilage on one of his knees - not sure which about 10 years ago.  Normal uric acid, ESR, ANA in 09/2019.     Low testosterone: Appt with endocrine next week to discuss - prior treatment in past, stopped d/t concerns of replacement causing risk of prostate CA - plans to discuss with endocrine.   History Patient Active Problem List   Diagnosis Date Noted   Degeneration of lumbar or lumbosacral intervertebral disc 07/30/2013   OSA (obstructive sleep apnea) 03/16/2008   Past Medical History:  Diagnosis Date   Heart murmur    Seizures ([place]) 40 years ago    none since then per pt   Sleep apnea    CPAP    Past Surgical History:  Procedure Laterality Date   COLONOSCOPY  07/20/2005   Dr.Kaplan=normal exam    HERNIA REPAIR     KNEE SURGERY Left    6-7 years ago   No Known Allergies Prior to Admission medications   Medication Sig Start Date End Date Taking? Authorizing Provider   betamethasone dipropionate 0.05 % lotion Apply 1 application topically 2 (two) times daily. Pt uses as needed. Patient not taking: Reported on 12/28/2020 04/20/20   [provider]  ibuprofen (ADVIL) 800 MG tablet Take 800 mg by mouth 3 (three) times daily. Patient not taking: Reported on 12/28/2020 05/16/20   [provider]   Social History   Socioeconomic History   Marital status: Married    Spouse name: Not on file   Number of children: Not on file   Years of education: Not on file   Highest education level: Not on file  Occupational History   Not on file  Tobacco Use   Smoking status: Never   Smokeless tobacco: Never  Vaping Use   Vaping Use: Never used  Substance and Sexual Activity   Alcohol use: No   Drug use: No   Sexual activity: Not on file  Other Topics Concern   Not on file  Social History Narrative   Right handed   Lives with wife in a two story home.   Social Determinants of Health   Financial Resource Strain: Not on file  Food Insecurity: Not on file  Transportation Needs: Not on file  Physical Activity: Not on file  Stress: Not on file  Social Connections: Not on file  Intimate Partner Violence: Not on file    Review of Systems   Objective:  Vitals:   12/28/20 1444  BP: 126/70  Pulse: 61  Resp: 16  Temp: 98.4 F (36.9 C)  TempSrc: Temporal  SpO2: 95%  Weight: 161 lb 12.8 oz (73.4 kg)  Height: 5' 8"  (1.727 m)     Physical Exam Vitals reviewed.  Constitutional:      General: He is not in acute distress.    Appearance: Normal appearance. He is well-developed.  HENT:     Head: Normocephalic and atraumatic.  Cardiovascular:     Rate and Rhythm: Normal rate.  Pulmonary:     Effort: Pulmonary effort is normal.  Musculoskeletal:     Comments: Right knee Full range of motion, no effusion, skin intact, no erythema.  No focal joint line tenderness including medial joint line, pes anserine bursa nontender, distal quads,  hamstrings nontender.  Negative Clark's compression, patellar tendon nontender but does describe area of discomfort with running at distal patellar pole, and into patellar tendon.  Negative McMurray, negative Lachman, negative varus/valgus stress.  Left knee Full range of motion, no effusion, skin intact, no erythema.  No focal joint line tenderness including medial/lateral joint line, pes anserine bursa nontender, distal quads hamstrings nontender.  Crepitus with forced compression testing but no pain.  Slight tenderness along the proximal aspect of patellar tendon without defect or apparent swelling.  Extensor mechanism intact bilaterally.  Negative McMurray, negative Lachman, negative varus/valgus stress.  Neurological:     Mental Status: He is alert and oriented to person, place, and time.  Psychiatric:        Mood and Affect: Mood normal.       Assessment & Plan:  Alexander George is a 65 y.o. male . Pain in both knees, unspecified chronicity Based on history and symptoms location, possibly multifactorial.  Suspect some component of patellar tendinitis, also patellofemoral pain syndrome as discussed last year.  Right knee with medial pain could be medial joint line versus pes anserine bursa.  Both areas nontender on exam.    -Trial of Cho-Pat/patellar tendon strap with running, can try bilateral.  Stop if worsening symptoms.  -VMO/quad strengthening discussed  -Handout given on patellar tendinitis as well as patellofemoral pain syndrome  -Tylenol and Voltaren topical discussed as treatment options for discomfort.  Activity modification  -Return in 4 weeks with running shoes, can evaluate wear pattern, gait further at that time but would consider sports medicine eval/orthopedic eval for further gait analysis, possible ultrasound.  No orders of the defined types were placed in this encounter.  Patient Instructions  You may have some patellar tendinitis on right . See info below. Try  chopat strap (patellar tendon strap) for left knee. [employer], or other Sporting goods store may have these.  You may also have some patellofemoral syndrome, especially on the right.  See info below. Strengthen thigh muscles may help. Can also try the strap to that knee to see it helps. If worse pain with brace, then stop using it.  Ok to use tylenol or topical voltaren gel for both knees.  Rechekc in next 4 weeks. Sooner if worse, or I can refer to orthopaedic doctor or sports medicine.  Bring your running shoes to next visit.  Patellofemoral Pain Syndrome  Patellofemoral pain syndrome is a condition in which the tissue (cartilage) on the underside of the kneecap (patella) softens or breaks down. This causes pain in the front of the knee. The condition is also called runner's knee or chondromalacia patella. Patellofemoral pain syndrome is most common  in young adults who are active insports. The knee is the largest joint in the body. The patella covers the front of the knee and is attached to muscles above and below the knee. The underside of the patella is covered with a smooth type of cartilage (synovium). The smooth surface helps the patella glide easily when you move your knee. Patellofemoral pain syndrome causes swelling in the joint linings and bonesurfaces in the knee. What are the causes? This condition may be caused by: Overuse of the knee. Poor alignment of your knee joints. Weak leg muscles. A direct hit to your kneecap. What increases the risk? You are more likely to develop this condition if: You do a lot of activities that can wear down your kneecap. These include: Running. Squatting. Climbing stairs. You start a new physical activity or exercise program. You wear shoes that do not fit well. You do not have good leg strength. You are overweight. What are the signs or symptoms? The main symptom of this condition is knee pain. This may feel like a dull, aching pain  underneath your patella, in the front of your knee. There may be a popping or cracking sound when you move your knee. Pain may get worse with: Exercise. Climbing stairs. Running. Jumping. Squatting. Kneeling. Sitting for a long time. Moving or pushing on your patella. How is this diagnosed? This condition may be diagnosed based on: Your symptoms and medical history. You may be asked about your recent physical activities and which ones cause knee pain. A physical exam. This may include: Moving your patella back and forth. Checking your range of knee motion. Having you squat or jump to see if you have pain. Checking the strength of your leg muscles. Imaging tests to confirm the diagnosis. These may include an MRI of your knee. How is this treated? This condition may be treated at home with rest, ice, compression, andelevation (RICE).  Other treatments may include: NSAIDs, such as ibuprofen. Physical therapy to stretch and strengthen your leg muscles. Shoe inserts (orthotics) to take stress off your knee. A knee brace or knee support. Adhesive tapes to the skin. Surgery to remove damaged cartilage or move the patella to a better position. This is rare. Follow these instructions at home: If you have a brace: Wear the brace as told by your health care provider. Remove it only as told by your health care provider. Loosen the brace if your toes tingle, become numb, or turn cold and blue. Keep the brace clean. If the brace is not waterproof: Do not let it get wet. Cover it with a watertight covering when you take a bath or a shower. Managing pain, stiffness, and swelling  If directed, put ice on the painful area. To do this: If you have a removable brace, remove it as told by your health care provider. Put ice in a plastic bag. Place a towel between your skin and the bag. Leave the ice on for 20 minutes, 2-3 times a day. Remove the ice if your skin turns bright red. This is very  important. If you cannot feel pain, heat, or cold, you have a greater risk of damage to the area. Move your toes often to reduce stiffness and swelling. Raise (elevate) the injured area above the level of your heart while you are sitting or lying down.  Activity Rest your knee. Avoid activities that cause knee pain. Perform stretching and strengthening exercises as told by your health care provider or physical therapist.  Return to your normal activities as told by your health care provider. Ask your health care provider what activities are safe for you. General instructions Take over-the-counter and prescription medicines only as told by your health care provider. Use splints, braces, knee supports, or walking aids as directed by your health care provider. Do not use any products that contain nicotine or tobacco, such as cigarettes, e-cigarettes, and chewing tobacco. These can delay healing. If you need help quitting, ask your health care provider. Keep all follow-up visits. This is important. Contact a health care provider if: Your symptoms get worse. You are not improving with home care. Summary Patellofemoral pain syndrome is a condition in which the tissue (cartilage) on the underside of the kneecap (patella) softens or breaks down. This condition causes swelling in the joint linings and bone surfaces in the knee. This leads to pain in the front of the knee. This condition may be treated at home with rest, ice, compression, and elevation (RICE). Use splints, braces, knee supports, or walking aids as directed by your health care provider. This information is not intended to replace advice given to you by your health care provider. Make sure you discuss any questions you have with your healthcare provider. Document Revised: 11/11/2019 Document Reviewed: 11/11/2019 Elsevier Patient Education  2022 [employer].   Patellar Tendinitis  Patellar tendinitis is also called jumper's knee or  patellar tendinopathy. This condition happens when there is damage to and inflammation of the patellar tendon. Tendons are cord-like tissues that connect muscles to bones. The patellar tendon connects the bottom of the kneecap (patella) to the top of the shin bone (tibia). Patellar tendinitis causes pain in the front of the knee. The condition happens in the following stages: Stage 1: In this stage, you have pain only after activity. Stage 2: In this stage, you have pain during and after activity. Stage 3: In this stage, you have pain at rest as well as during and after activity. The pain limits your ability to do the activity. Stage 4: In this stage, the tendon tears and severely limits your activity. What are the causes? This condition is caused by repeated (repetitive) stress on the tendon. This stress may cause the tendon to stretch, swell,thicken, or tear. What increases the risk? The following factors may make you more likely to develop this condition: Participating in sports that involve running, kicking, and jumping, especially on hard surfaces. These include: Basketball. Volleyball. Soccer. Track and field. Training too hard. Having tight thigh muscles. Having received steroid injections in the tendon. Having had knee surgery. Being 61-43 years old. Having rheumatoid arthritis, diabetes, or kidney disease. These conditions interrupt blood flow to the knee, causing the tendon to weaken. What are the signs or symptoms? The main symptom of this condition is pain and swelling in the front of the knee. The pain usually starts slowly and gradually gets worse. It may become painful to straighten your leg. The pain may get worse when you walk, run, orjump. How is this diagnosed? This condition may be diagnosed based on: Your symptoms. Your medical history. A physical exam. During the physical exam, your health care provider may check for: Tenderness in your patella. Tightness in your  thigh muscles. Pain when you straighten your knee. Imaging tests, including: X-rays. These will show the position and condition of your patella. An MRI. This will show any abnormality of the tendon. Ultrasound. This will show any swelling or other abnormalities of the tendon. How is this  treated? Treatment for this condition depends on the stage of the condition. It may involve: Avoiding activities that cause pain, such as jumping. Icing and elevating your knee. Having sound wave stimulation to promote healing. Doing stretching and strengthening exercises (physical therapy) when pain and swelling improve. Wearing a knee brace. This may be needed if your condition does not improve with treatment. Using crutches or a walker. This may be needed if your condition does not improve with treatment. Surgery. This may be done if you have stage 4 tendinitis. Follow these instructions at home: If you have a brace: Wear the brace as told by your health care provider. Remove it only as told by your health care provider. Loosen the brace if your toes tingle, become numb, or turn cold and blue. Keep the brace clean. If the brace is not waterproof: Do not let it get wet. Cover it with a watertight covering when you take a bath or shower. Ask your health care provider when it is safe for you to drive. Managing pain, stiffness, and swelling  If directed, put ice on the injured area. If you have a removable brace, remove it as told by your health care provider. Put ice in a plastic bag. Place a towel between your skin and the bag. Leave the ice on for 20 minutes, 2-3 times a day. Move your toes often to reduce stiffness and swelling. Raise (elevate) your knee above the level of your heart while you are sitting or lying down.  Activity Do not use the injured limb to support your body weight until your health care provider says that you can. Use your crutches or a walker as told by your health care  provider. Return to your normal activities as told by your health care provider. Ask your health care provider what activities are safe for you. Do exercises as told by your health care provider or physical therapist. General instructions Take over-the-counter and prescription medicines only as told by your health care provider. Do not use any products that contain nicotine or tobacco, such as cigarettes, e-cigarettes, and chewing tobacco. These can delay healing. If you need help quitting, ask your health care provider. Keep all follow-up visits as told by your health care provider. This is important. How is this prevented? Warm up and stretch before being active. Cool down and stretch after being active. Give your body time to rest between periods of activity. You may need to reduce how often you play a sport that requires frequent jumping. Make sure to use equipment that fits you. Be safe and responsible while being active. This will help you avoid falls which can damage the tendon. Do at least 150 minutes of moderate-intensity exercise each week, such as brisk walking or water aerobics. Maintain physical fitness, including: Strength. Flexibility. Cardiovascular fitness. Endurance. Contact a health care provider if: Your symptoms have not improved in 6 weeks. Your symptoms get worse. Summary Patellar tendinitis is also called jumper's knee or patellar tendinopathy. This condition happens when there is damage to and inflammation of the patellar tendon. Treatment for this condition depends on the stage of the condition and may include rest, ice, exercises, medicines, and surgery. Do not use the injured limb to support your body weight until your health care provider says that you can. Take over-the-counter and prescription medicines only as told by your health care provider. Keep all follow-up visits as told by your health care provider. This is important. This information is not  intended  to replace advice given to you by your health care provider. Make sure you discuss any questions you have with your healthcare provider. Document Revised: 09/18/2018 Document Reviewed: 04/21/2018 Elsevier Patient Education  2022 [place],   Merri Ray, MD [facility] 12/28/20 3:44 PM

## 2020-12-28 NOTE — Patient Instructions (Signed)
You may have some patellar tendinitis on right . See info below. Try chopat strap (patellar tendon strap) for left knee. [employer], or other Sporting goods store may have these.  You may also have some patellofemoral syndrome, especially on the right.  See info below. Strengthen thigh muscles may help. Can also try the strap to that knee to see it helps. If worse pain with brace, then stop using it.  Ok to use tylenol or topical voltaren gel for both knees.  Rechekc in next 4 weeks. Sooner if worse, or I can refer to orthopaedic doctor or sports medicine.  Bring your running shoes to next visit.  Patellofemoral Pain Syndrome  Patellofemoral pain syndrome is a condition in which the tissue (cartilage) on the underside of the kneecap (patella) softens or breaks down. This causes pain in the front of the knee. The condition is also called runner's knee or chondromalacia patella. Patellofemoral pain syndrome is most common in young adults who are active insports. The knee is the largest joint in the body. The patella covers the front of the knee and is attached to muscles above and below the knee. The underside of the patella is covered with a smooth type of cartilage (synovium). The smooth surface helps the patella glide easily when you move your knee. Patellofemoral pain syndrome causes swelling in the joint linings and bonesurfaces in the knee. What are the causes? This condition may be caused by: Overuse of the knee. Poor alignment of your knee joints. Weak leg muscles. A direct hit to your kneecap. What increases the risk? You are more likely to develop this condition if: You do a lot of activities that can wear down your kneecap. These include: Running. Squatting. Climbing stairs. You start a new physical activity or exercise program. You wear shoes that do not fit well. You do not have good leg strength. You are overweight. What are the signs or symptoms? The main symptom of  this condition is knee pain. This may feel like a dull, aching pain underneath your patella, in the front of your knee. There may be a popping or cracking sound when you move your knee. Pain may get worse with: Exercise. Climbing stairs. Running. Jumping. Squatting. Kneeling. Sitting for a long time. Moving or pushing on your patella. How is this diagnosed? This condition may be diagnosed based on: Your symptoms and medical history. You may be asked about your recent physical activities and which ones cause knee pain. A physical exam. This may include: Moving your patella back and forth. Checking your range of knee motion. Having you squat or jump to see if you have pain. Checking the strength of your leg muscles. Imaging tests to confirm the diagnosis. These may include an MRI of your knee. How is this treated? This condition may be treated at home with rest, ice, compression, andelevation (RICE).  Other treatments may include: NSAIDs, such as ibuprofen. Physical therapy to stretch and strengthen your leg muscles. Shoe inserts (orthotics) to take stress off your knee. A knee brace or knee support. Adhesive tapes to the skin. Surgery to remove damaged cartilage or move the patella to a better position. This is rare. Follow these instructions at home: If you have a brace: Wear the brace as told by your health care provider. Remove it only as told by your health care provider. Loosen the brace if your toes tingle, become numb, or turn cold and blue. Keep the brace clean. If the brace is  not waterproof: Do not let it get wet. Cover it with a watertight covering when you take a bath or a shower. Managing pain, stiffness, and swelling  If directed, put ice on the painful area. To do this: If you have a removable brace, remove it as told by your health care provider. Put ice in a plastic bag. Place a towel between your skin and the bag. Leave the ice on for 20 minutes, 2-3 times a  day. Remove the ice if your skin turns bright red. This is very important. If you cannot feel pain, heat, or cold, you have a greater risk of damage to the area. Move your toes often to reduce stiffness and swelling. Raise (elevate) the injured area above the level of your heart while you are sitting or lying down.  Activity Rest your knee. Avoid activities that cause knee pain. Perform stretching and strengthening exercises as told by your health care provider or physical therapist. Return to your normal activities as told by your health care provider. Ask your health care provider what activities are safe for you. General instructions Take over-the-counter and prescription medicines only as told by your health care provider. Use splints, braces, knee supports, or walking aids as directed by your health care provider. Do not use any products that contain nicotine or tobacco, such as cigarettes, e-cigarettes, and chewing tobacco. These can delay healing. If you need help quitting, ask your health care provider. Keep all follow-up visits. This is important. Contact a health care provider if: Your symptoms get worse. You are not improving with home care. Summary Patellofemoral pain syndrome is a condition in which the tissue (cartilage) on the underside of the kneecap (patella) softens or breaks down. This condition causes swelling in the joint linings and bone surfaces in the knee. This leads to pain in the front of the knee. This condition may be treated at home with rest, ice, compression, and elevation (RICE). Use splints, braces, knee supports, or walking aids as directed by your health care provider. This information is not intended to replace advice given to you by your health care provider. Make sure you discuss any questions you have with your healthcare provider. Document Revised: 11/11/2019 Document Reviewed: 11/11/2019 Elsevier Patient Education  2022 [employer].   Patellar  Tendinitis  Patellar tendinitis is also called jumper's knee or patellar tendinopathy. This condition happens when there is damage to and inflammation of the patellar tendon. Tendons are cord-like tissues that connect muscles to bones. The patellar tendon connects the bottom of the kneecap (patella) to the top of the shin bone (tibia). Patellar tendinitis causes pain in the front of the knee. The condition happens in the following stages: Stage 1: In this stage, you have pain only after activity. Stage 2: In this stage, you have pain during and after activity. Stage 3: In this stage, you have pain at rest as well as during and after activity. The pain limits your ability to do the activity. Stage 4: In this stage, the tendon tears and severely limits your activity. What are the causes? This condition is caused by repeated (repetitive) stress on the tendon. This stress may cause the tendon to stretch, swell,thicken, or tear. What increases the risk? The following factors may make you more likely to develop this condition: Participating in sports that involve running, kicking, and jumping, especially on hard surfaces. These include: Basketball. Volleyball. Soccer. Track and field. Training too hard. Having tight thigh muscles. Having received steroid injections  in the tendon. Having had knee surgery. Being 62-53 years old. Having rheumatoid arthritis, diabetes, or kidney disease. These conditions interrupt blood flow to the knee, causing the tendon to weaken. What are the signs or symptoms? The main symptom of this condition is pain and swelling in the front of the knee. The pain usually starts slowly and gradually gets worse. It may become painful to straighten your leg. The pain may get worse when you walk, run, orjump. How is this diagnosed? This condition may be diagnosed based on: Your symptoms. Your medical history. A physical exam. During the physical exam, your health care provider  may check for: Tenderness in your patella. Tightness in your thigh muscles. Pain when you straighten your knee. Imaging tests, including: X-rays. These will show the position and condition of your patella. An MRI. This will show any abnormality of the tendon. Ultrasound. This will show any swelling or other abnormalities of the tendon. How is this treated? Treatment for this condition depends on the stage of the condition. It may involve: Avoiding activities that cause pain, such as jumping. Icing and elevating your knee. Having sound wave stimulation to promote healing. Doing stretching and strengthening exercises (physical therapy) when pain and swelling improve. Wearing a knee brace. This may be needed if your condition does not improve with treatment. Using crutches or a walker. This may be needed if your condition does not improve with treatment. Surgery. This may be done if you have stage 4 tendinitis. Follow these instructions at home: If you have a brace: Wear the brace as told by your health care provider. Remove it only as told by your health care provider. Loosen the brace if your toes tingle, become numb, or turn cold and blue. Keep the brace clean. If the brace is not waterproof: Do not let it get wet. Cover it with a watertight covering when you take a bath or shower. Ask your health care provider when it is safe for you to drive. Managing pain, stiffness, and swelling  If directed, put ice on the injured area. If you have a removable brace, remove it as told by your health care provider. Put ice in a plastic bag. Place a towel between your skin and the bag. Leave the ice on for 20 minutes, 2-3 times a day. Move your toes often to reduce stiffness and swelling. Raise (elevate) your knee above the level of your heart while you are sitting or lying down.  Activity Do not use the injured limb to support your body weight until your health care provider says that you  can. Use your crutches or a walker as told by your health care provider. Return to your normal activities as told by your health care provider. Ask your health care provider what activities are safe for you. Do exercises as told by your health care provider or physical therapist. General instructions Take over-the-counter and prescription medicines only as told by your health care provider. Do not use any products that contain nicotine or tobacco, such as cigarettes, e-cigarettes, and chewing tobacco. These can delay healing. If you need help quitting, ask your health care provider. Keep all follow-up visits as told by your health care provider. This is important. How is this prevented? Warm up and stretch before being active. Cool down and stretch after being active. Give your body time to rest between periods of activity. You may need to reduce how often you play a sport that requires frequent jumping. Make sure to  use equipment that fits you. Be safe and responsible while being active. This will help you avoid falls which can damage the tendon. Do at least 150 minutes of moderate-intensity exercise each week, such as brisk walking or water aerobics. Maintain physical fitness, including: Strength. Flexibility. Cardiovascular fitness. Endurance. Contact a health care provider if: Your symptoms have not improved in 6 weeks. Your symptoms get worse. Summary Patellar tendinitis is also called jumper's knee or patellar tendinopathy. This condition happens when there is damage to and inflammation of the patellar tendon. Treatment for this condition depends on the stage of the condition and may include rest, ice, exercises, medicines, and surgery. Do not use the injured limb to support your body weight until your health care provider says that you can. Take over-the-counter and prescription medicines only as told by your health care provider. Keep all follow-up visits as told by your health  care provider. This is important. This information is not intended to replace advice given to you by your health care provider. Make sure you discuss any questions you have with your healthcare provider. Document Revised: 09/18/2018 Document Reviewed: 04/21/2018 Elsevier Patient Education  2022 [employer].

## 2021-01-12 ENCOUNTER — Telehealth: Payer: Self-pay | Admitting: *Deleted

## 2021-01-12 NOTE — Telephone Encounter (Signed)
Cyril Mourning,    Thank you for the note.  I have copied this to our anesthesia provider with a request to perform chart review and contact the patient.  ----------------------------------  Jenny Reichmann,    Please see the [place] nursing note to me.  I reviewed this patient's comprehensive Cardiology consult note from 06/09/20.  Please do the same and then contact this patient to discuss his chest pain, and then determine if any further cardiac evaluation is necessary before a procedure in the [place].  Thank you  - H. Loletha Carrow, MD

## 2021-01-12 NOTE — Telephone Encounter (Signed)
Dr. Loletha Carrow,  This pt is coming in for a recall colonoscopy on 01-31-21.  While getting his chart ready for PV, I noted he saw his cardiologist at the end of last year for chest pain, on 06-09-20.  His cardiologist did an EKG and did not recommend any further cardiac workup, just a 6 month follow.  I do not see where the patient has had any issues with chest pain since then.  I just wanted to make you aware of this and make sure he is ok to proceed as a direct.  Thanks, [employer]

## 2021-01-18 ENCOUNTER — Telehealth: Payer: Self-pay | Admitting: Family Medicine

## 2021-01-18 DIAGNOSIS — R7989 Other specified abnormal findings of blood chemistry: Secondary | ICD-10-CM

## 2021-01-18 NOTE — Telephone Encounter (Signed)
Hi John, this pt stopped by at the office today for an unrelated reason to that one that you need to speak with him. I mentioned him that you have been trying to reach him. Pls call him at [phone]. Thank you.

## 2021-01-18 NOTE — Telephone Encounter (Signed)
Alexander George,    From today's chart note by Osvaldo Angst, CRNA on this patient:  "Dr. Loletha Carrow,   After many attempts, I was finally able to speak with Alexander George.  He reports only intermittent mild CP but is comforted by the fact that his cardiologist has r/o a cardiac etiology.  This pt is cleared for anesthetic care at [facility].   Thanks,   Osvaldo Angst "  __________________________  Please let this patient know he should come for his [facility] procedure as scheduled.  - H. Danis

## 2021-01-18 NOTE — Telephone Encounter (Signed)
Pt came in the office today asking for referrals to see Alexander George and Alexander George. He states that he and Dr. Carlota Raspberry talked about this at his last appt.  Please advise   Pt can be reached at the home #

## 2021-01-19 NOTE — Telephone Encounter (Signed)
Called patient to inform him that he was cleared for procedure at [facility]. No answer, mail box full unable to leave a message. The patient has a pre-visit scheduled we will make sure we talk with him then about the procedure.

## 2021-01-19 NOTE — Telephone Encounter (Signed)
Referral ordered

## 2021-01-26 ENCOUNTER — Encounter: Payer: Self-pay | Admitting: Gastroenterology

## 2021-01-26 ENCOUNTER — Telehealth: Payer: Self-pay | Admitting: Gastroenterology

## 2021-01-26 ENCOUNTER — Ambulatory Visit (AMBULATORY_SURGERY_CENTER): Payer: Self-pay | Admitting: *Deleted

## 2021-01-26 ENCOUNTER — Other Ambulatory Visit: Payer: Self-pay

## 2021-01-26 VITALS — Ht 68.0 in | Wt 161.0 lb

## 2021-01-26 DIAGNOSIS — Z86010 Personal history of colonic polyps: Secondary | ICD-10-CM

## 2021-01-26 MED ORDER — PLENVU 140 G PO SOLR: 1.0000 | ORAL | 0 refills | Status: DC

## 2021-01-26 NOTE — Telephone Encounter (Signed)
Inbound call from patient. First time he called he had questions about food prep. I tried speaking with him to ask him exactly what questions he had, letting him know the food options would be on the print out given to him today. He hung up on me when I was trying to find out exactly what he needed.   Patient called in again demanding to speak with a nurse. I tried to ask reason why so I can send message. He just repeated "are you a nurse, are you a nurse, if not I will show up tomorrow" I told him no I will send a message for them to return call. Patient just hung up phone again.

## 2021-01-26 NOTE — Progress Notes (Signed)
No egg or soy allergy known to patient  No issues with past sedation with any surgeries or procedures Patient denies ever being told they had issues or difficulty with intubation  No FH of Malignant Hyperthermia No diet pills per patient No home 02 use per patient  No blood thinners per patient  Pt denies issues with constipation  No A fib or A flutter  EMMI video to pt or via MyChart  COVID 19 guidelines implemented in PV today with Pt and RN  Pt is fully vaccinated  for Covid   Plenvu Coupon given to pt in PV today , Code to Pharmacy and  NO PA's for preps discussed with pt In PV today  Discussed with pt there will be an out-of-pocket cost for prep and that varies from $0 to 70 dollars   Due to the COVID-19 pandemic we are asking patients to follow certain guidelines.  Pt aware of COVID protocols and [facility] guidelines   

## 2021-01-26 NOTE — Telephone Encounter (Signed)
Can he eat cheese  and Pizza with cooked veggies  Explained yes the 5 days prior this is ok just not the day before - pt verbalized understanding

## 2021-01-31 ENCOUNTER — Other Ambulatory Visit: Payer: Self-pay

## 2021-01-31 ENCOUNTER — Ambulatory Visit (AMBULATORY_SURGERY_CENTER): Payer: BC Managed Care – PPO | Admitting: Gastroenterology

## 2021-01-31 ENCOUNTER — Encounter: Payer: Self-pay | Admitting: Gastroenterology

## 2021-01-31 VITALS — BP 104/62 | HR 64 | Temp 98.4°F | Resp 8 | Ht 68.0 in | Wt 161.0 lb

## 2021-01-31 DIAGNOSIS — D122 Benign neoplasm of ascending colon: Secondary | ICD-10-CM

## 2021-01-31 DIAGNOSIS — K635 Polyp of colon: Secondary | ICD-10-CM

## 2021-01-31 DIAGNOSIS — Z86010 Personal history of colonic polyps: Secondary | ICD-10-CM | POA: Diagnosis present

## 2021-01-31 MED ORDER — SODIUM CHLORIDE 0.9 % IV SOLN
500.0000 mL | Freq: Once | INTRAVENOUS | Status: DC
Start: 2021-01-31 — End: 2021-01-31

## 2021-01-31 NOTE — Op Note (Signed)
[place] Patient Name: Alexander George Procedure Date: 01/31/2021 3:13 PM MRN: [id number] Endoscopist: Mallie Mussel L. Loletha Carrow , MD Age: 65 Referring MD:  Date of Birth: 23-Apr-1956 Gender: Male Account #: [id number] Procedure:                Colonoscopy Indications:              Surveillance: Personal history of adenomatous                            polyps on last colonoscopy 3 years ago                           8-31m TA aug 2019 Medicines:                Monitored Anesthesia Care Procedure:                Pre-Anesthesia Assessment:                           - Prior to the procedure, a History and Physical                            was performed, and patient medications and                            allergies were reviewed. The patient's tolerance of                            previous anesthesia was also reviewed. The risks                            and benefits of the procedure and the sedation                            options and risks were discussed with the patient.                            All questions were answered, and informed consent                            was obtained. Prior Anticoagulants: The patient has                            taken no previous anticoagulant or antiplatelet                            agents. ASA Grade Assessment: II - A patient with                            mild systemic disease. After reviewing the risks                            and benefits, the patient was deemed in  satisfactory condition to undergo the procedure.                           After obtaining informed consent, the colonoscope                            was passed under direct vision. Throughout the                            procedure, the patient's blood pressure, pulse, and                            oxygen saturations were monitored continuously. The                            CF [id number] was introduced through the anus                             and advanced to the the cecum, identified by                            appendiceal orifice and ileocecal valve. The                            colonoscopy was performed without difficulty. The                            patient tolerated the procedure well. The quality                            of the bowel preparation was good. The ileocecal                            valve, appendiceal orifice, and rectum were                            photographed. Scope In: 3:28:31 PM Scope Out: 3:44:42 PM Scope Withdrawal Time: 0 hours 11 minutes 2 seconds  Total Procedure Duration: 0 hours 16 minutes 11 seconds  Findings:                 The perianal and digital rectal examinations were                            normal.                           A diminutive polyp was found in the ascending                            colon. The polyp was flat. The polyp was removed                            with a cold snare. Resection and retrieval were  complete.                           The entire examined colon appeared normal on direct                            and retroflexion views. Complications:            No immediate complications. Estimated Blood Loss:     Estimated blood loss was minimal. Impression:               - One diminutive polyp in the ascending colon,                            removed with a cold snare. Resected and retrieved.                           - The entire examined colon is normal on direct and                            retroflexion views. Recommendation:           - Patient has a contact number available for                            emergencies. The signs and symptoms of potential                            delayed complications were discussed with the                            patient. Return to normal activities tomorrow.                            Written discharge instructions were provided to the                             patient.                           - Resume previous diet.                           - Continue present medications.                           - Await pathology results.                           - Repeat colonoscopy is recommended for                            surveillance. The colonoscopy date will be                            determined after pathology results from today's  exam become available for review. [name] L. Loletha Carrow, MD 01/31/2021 3:49:50 PM This report has been signed electronically.

## 2021-01-31 NOTE — Progress Notes (Signed)
History and Physical:  This patient presents for endoscopic testing for: Encounter Diagnosis  Name Primary?   Personal history of colonic polyps Yes   Patient has no complaints today.   Past Medical History: Past Medical History:  Diagnosis Date   Arthritis    very little in knees   Cataract    forming   GERD (gastroesophageal reflux disease)    Heart murmur    Neuromuscular disorder (HCC)    pinched nerve in neck, makes fingers numb in hand left- and also back pain   Seizures ([place]) 40 years ago    none since then per pt   Sleep apnea    CPAP      Past Surgical History: Past Surgical History:  Procedure Laterality Date   COLONOSCOPY  07/20/2005   Dr.Kaplan=normal exam , 01/31/21-Dr Danis at [facility]    6-7 years ago   POLYPECTOMY      Allergies: No Known Allergies  Outpatient Meds: Current Outpatient Medications  Medication Sig Dispense Refill   betamethasone dipropionate 0.05 % lotion Apply 1 application topically 2 (two) times daily. Pt uses as needed. (Patient not taking: No sig reported)     ibuprofen (ADVIL) 800 MG tablet Take 800 mg by mouth 3 (three) times daily.     triamcinolone cream (KENALOG) 0.1 % Apply topically 2 (two) times daily as needed. (Patient not taking: No sig reported)     Current Facility-Administered Medications  Medication Dose Route Frequency Provider Last Rate Last Admin   0.9 %  sodium chloride infusion  500 mL Intravenous Once Doran Stabler, MD          ___________________________________________________________________ Objective   Exam:  BP 112/66   Pulse 68   Temp 98.4 F (36.9 C) (Temporal)   Ht '5\' 8"'$  (1.727 m)   Wt 161 lb (73 kg)   SpO2 98%   BMI 24.48 kg/m   CV: RRR without murmur, S1/S2 Resp: clear to auscultation bilaterally, normal RR and effort noted GI: soft, no tenderness, with active bowel sounds.   Assessment: Encounter Diagnosis  Name Primary?   Personal  history of colonic polyps Yes     Plan: Colonoscopy  The benefits and risks of the planned procedure were described in detail with the patient or (when appropriate) their health care proxy.  Risks were outlined as including, but not limited to, bleeding, infection, perforation, adverse medication reaction leading to cardiac or pulmonary decompensation, pancreatitis (if ERCP).  The limitation of incomplete mucosal visualization was also discussed.  No guarantees or warranties were given.    The patient is appropriate for an endoscopic procedure in the ambulatory setting.   - Wilfrid Lund, MD

## 2021-01-31 NOTE — Progress Notes (Signed)
Called to room to assist during endoscopic procedure.  Patient ID and intended procedure confirmed with present staff. Received instructions for my participation in the procedure from the performing physician.Called to room to assist during endoscopic procedure.  Patient ID and intended procedure confirmed with present staff. Received instructions for my participation in the procedure from the performing physician. 

## 2021-01-31 NOTE — Progress Notes (Signed)
Pt's states no medical or surgical changes since previsit or office visit.   VS [place]

## 2021-01-31 NOTE — Patient Instructions (Signed)
Handout on polyps given to you today  Await pathology results om one polyp removed    YOU HAD AN ENDOSCOPIC PROCEDURE TODAY AT [facility]:   Refer to the procedure report that was given to you for any specific questions about what was found during the examination.  If the procedure report does not answer your questions, please call your gastroenterologist to clarify.  If you requested that your care partner not be given the details of your procedure findings, then the procedure report has been included in a sealed envelope for you to review at your convenience later.  YOU SHOULD EXPECT: Some feelings of bloating in the abdomen. Passage of more gas than usual.  Walking can help get rid of the air that was put into your GI tract during the procedure and reduce the bloating. If you had a lower endoscopy (such as a colonoscopy or flexible sigmoidoscopy) you may notice spotting of blood in your stool or on the toilet paper. If you underwent a bowel prep for your procedure, you may not have a normal bowel movement for a few days.  Please Note:  You might notice some irritation and congestion in your nose or some drainage.  This is from the oxygen used during your procedure.  There is no need for concern and it should clear up in a day or so.  SYMPTOMS TO REPORT IMMEDIATELY:  Following lower endoscopy (colonoscopy or flexible sigmoidoscopy):  Excessive amounts of blood in the stool  Significant tenderness or worsening of abdominal pains  Swelling of the abdomen that is new, acute  Fever of 100F or higher   For urgent or emergent issues, a gastroenterologist can be reached at any hour by calling [phone]. Do not use MyChart messaging for urgent concerns.    DIET:  We do recommend a small meal at first, but then you may proceed to your regular diet.  Drink plenty of fluids but you should avoid alcoholic beverages for 24 hours.  ACTIVITY:  You should plan to take it  easy for the rest of today and you should NOT DRIVE or use heavy machinery until tomorrow (because of the sedation medicines used during the test).    FOLLOW UP: Our staff will call the number listed on your records 48-72 hours following your procedure to check on you and address any questions or concerns that you may have regarding the information given to you following your procedure. If we do not reach you, we will leave a message.  We will attempt to reach you two times.  During this call, we will ask if you have developed any symptoms of COVID 19. If you develop any symptoms (ie: fever, flu-like symptoms, shortness of breath, cough etc.) before then, please call [phone].  If you test positive for Covid 19 in the 2 weeks post procedure, please call and report this information to [place].    If any biopsies were taken you will be contacted by phone or by letter within the next 1-3 weeks.  Please call us at [phone] if you have not heard about the biopsies in 3 weeks.    SIGNATURES/CONFIDENTIALITY: You and/or your care partner have signed paperwork which will be entered into your electronic medical record.  These signatures attest to the fact that that the information above on your After Visit Summary has been reviewed and is understood.  Full responsibility of the confidentiality of this discharge information lies with you and/or your  care-partner.  

## 2021-01-31 NOTE — Progress Notes (Signed)
To PACU, VSS. Report to [email or web address] 

## 2021-01-31 NOTE — Progress Notes (Signed)
Called to room to assist during endoscopic procedure.  Patient ID and intended procedure confirmed with present staff. Received instructions for my participation in the procedure from the performing physician.  

## 2021-02-02 ENCOUNTER — Telehealth: Payer: Self-pay | Admitting: *Deleted

## 2021-02-02 ENCOUNTER — Telehealth: Payer: Self-pay

## 2021-02-02 NOTE — Telephone Encounter (Signed)
  Follow up Call-  Call back number 01/31/2021  Post procedure Call Back phone  # [phone]  Permission to leave phone message Yes  Some recent data might be hidden     Patient questions:  Do you have a fever, pain , or abdominal swelling? No. Pain Score  0 *  Have you tolerated food without any problems? Yes.    Have you been able to return to your normal activities? Yes.    Do you have any questions about your discharge instructions: Diet   No. Medications  No. Follow up visit  No.  Do you have questions or concerns about your Care? No.  Actions: * If pain score is 4 or above: No action needed, pain <4.  Have you developed a fever since your procedure? no  2.   Have you had an respiratory symptoms (SOB or cough) since your procedure? no  3.   Have you tested positive for COVID 19 since your procedure   4.   Have you had any family members/close contacts diagnosed with the COVID 19 since your procedure?  no   If yes to any of these questions please route to Joylene John, RN and Joella Prince, RN

## 2021-02-02 NOTE — Telephone Encounter (Signed)
Left message on follow up call. 

## 2021-02-07 ENCOUNTER — Encounter: Payer: Self-pay | Admitting: Gastroenterology

## 2021-02-24 ENCOUNTER — Other Ambulatory Visit: Payer: Self-pay

## 2021-02-24 ENCOUNTER — Other Ambulatory Visit: Payer: BC Managed Care – PPO

## 2021-03-02 ENCOUNTER — Telehealth: Payer: Self-pay | Admitting: Podiatry

## 2021-03-02 NOTE — Telephone Encounter (Signed)
Wells Guiles from [employer]) left message stating pt called themasking about pricing for the appt for Monday.  I returned call and pt has called wanting to get pricing for something he is getting on Monday. He is seeing the doctor for the first time since 2020 and I am not sure if what we are getting pt but I did give her orthotic codes and cost for her to discuss with pt as that maybe what is done. She is to contact pt with the information.

## 2021-03-06 ENCOUNTER — Other Ambulatory Visit: Payer: Self-pay

## 2021-03-06 ENCOUNTER — Ambulatory Visit: Payer: BC Managed Care – PPO | Admitting: Podiatry

## 2021-03-06 DIAGNOSIS — M7752 Other enthesopathy of left foot: Secondary | ICD-10-CM

## 2021-03-06 NOTE — Patient Instructions (Signed)

## 2021-03-06 NOTE — Progress Notes (Signed)
Subjective: 65 year old male presents the office today requesting orthotic adjustments.  He states that he has a leg length discrepancy and put heel lift in his shoe that was given to him previously.  He does states that orthotic used to bulking inside of his shoe.  The orthotic does help but not able to wear it.  No recent injury or changes to his foot.  Some occasional discomfort in the left inside portion of the foot and ankle.  Objective: AAO x3, NAD DP/PT pulses palpable bilaterally, CRT less than 3 seconds There is no area of pinpoint tenderness identified bilaterally.  There is slight discomfort in the course the posterior tibial tendon on the left side inferior to the medial malleolus towards navicular tuberosity.  Clinically the tendon appears to be intact.  MMT 5/5.  No pain with calf compression, swelling, warmth, erythema  Assessment: Posterior tibial tendinitis; limb length discrepancy  Plan: -All treatment options discussed with the patient including all alternatives, risks, complications.  -I am going to send the orthotic back to have their heels ground down is much as possible and wants to make sure that they are precise. -I think the orthotic was beneficial for the posterior tibial tendinitis on the left side.  Discussed stretching, icing daily as well. -Patient encouraged to call the office with any questions, concerns, change in symptoms.   Trula Slade DPM

## 2021-03-10 ENCOUNTER — Other Ambulatory Visit: Payer: BC Managed Care – PPO

## 2021-03-24 ENCOUNTER — Other Ambulatory Visit: Payer: Self-pay

## 2021-03-24 ENCOUNTER — Other Ambulatory Visit: Payer: BC Managed Care – PPO

## 2021-03-30 ENCOUNTER — Encounter: Payer: BLUE CROSS/BLUE SHIELD | Admitting: Family Medicine

## 2021-03-31 ENCOUNTER — Encounter: Payer: BLUE CROSS/BLUE SHIELD | Admitting: Family Medicine

## 2021-04-03 ENCOUNTER — Encounter: Payer: Self-pay | Admitting: Family Medicine

## 2021-04-03 ENCOUNTER — Other Ambulatory Visit: Payer: Self-pay

## 2021-04-03 ENCOUNTER — Ambulatory Visit (INDEPENDENT_AMBULATORY_CARE_PROVIDER_SITE_OTHER): Payer: BC Managed Care – PPO | Admitting: Family Medicine

## 2021-04-03 VITALS — BP 126/78 | HR 54 | Temp 98.2°F | Resp 16 | Ht 68.0 in | Wt 162.4 lb

## 2021-04-03 DIAGNOSIS — Z23 Encounter for immunization: Secondary | ICD-10-CM | POA: Diagnosis not present

## 2021-04-03 DIAGNOSIS — Z13220 Encounter for screening for lipoid disorders: Secondary | ICD-10-CM

## 2021-04-03 DIAGNOSIS — Z130 Encounter for screening for diseases of the blood and blood-forming organs and certain disorders involving the immune mechanism: Secondary | ICD-10-CM

## 2021-04-03 DIAGNOSIS — Z131 Encounter for screening for diabetes mellitus: Secondary | ICD-10-CM | POA: Diagnosis not present

## 2021-04-03 DIAGNOSIS — Z0000 Encounter for general adult medical examination without abnormal findings: Secondary | ICD-10-CM

## 2021-04-03 LAB — COMPREHENSIVE METABOLIC PANEL
ALT: 11 U/L (ref 0–53)
AST: 16 U/L (ref 0–37)
Albumin: 4.3 g/dL (ref 3.5–5.2)
Alkaline Phosphatase: 39 U/L (ref 39–117)
BUN: 21 mg/dL (ref 6–23)
CO2: 29 mEq/L (ref 19–32)
Calcium: 9 mg/dL (ref 8.4–10.5)
Chloride: 105 mEq/L (ref 96–112)
Creatinine, Ser: 1.02 mg/dL (ref 0.40–1.50)
GFR: 77.51 mL/min (ref >60.00)
Glucose, Bld: 101 mg/dL — ABNORMAL HIGH (ref 70–99)
Potassium: 4 mEq/L (ref 3.5–5.1)
Sodium: 141 mEq/L (ref 135–145)
Total Bilirubin: 0.7 mg/dL (ref 0.2–1.2)
Total Protein: 6.8 g/dL (ref 6.0–8.3)

## 2021-04-03 LAB — LIPID PANEL
Cholesterol: 154 mg/dL (ref 0–200)
HDL: 64.8 mg/dL (ref >39.00)
LDL Cholesterol: 79 mg/dL (ref 0–99)
NonHDL: 89.14
Total CHOL/HDL Ratio: 2
Triglycerides: 53 mg/dL (ref 0.0–149.0)
VLDL: 10.6 mg/dL (ref 0.0–40.0)

## 2021-04-03 LAB — CBC WITH DIFFERENTIAL/PLATELET
Basophils Absolute: 0 10*3/uL (ref 0.0–0.1)
Basophils Relative: 1.1 % (ref 0.0–3.0)
Eosinophils Absolute: 0.2 10*3/uL (ref 0.0–0.7)
Eosinophils Relative: 5.5 % — ABNORMAL HIGH (ref 0.0–5.0)
HCT: 40.8 % (ref 39.0–52.0)
Hemoglobin: 13.5 g/dL (ref 13.0–17.0)
Lymphocytes Relative: 48.5 % — ABNORMAL HIGH (ref 12.0–46.0)
Lymphs Abs: 2 10*3/uL (ref 0.7–4.0)
MCHC: 33.1 g/dL (ref 30.0–36.0)
MCV: 87.9 fl (ref 78.0–100.0)
Monocytes Absolute: 0.3 10*3/uL (ref 0.1–1.0)
Monocytes Relative: 8.3 % (ref 3.0–12.0)
Neutro Abs: 1.5 10*3/uL (ref 1.4–7.7)
Neutrophils Relative %: 36.6 % — ABNORMAL LOW (ref 43.0–77.0)
Platelets: 164 10*3/uL (ref 150.0–400.0)
RBC: 4.64 Mil/uL (ref 4.22–5.81)
RDW: 14.2 % (ref 11.5–15.5)
WBC: 4.1 10*3/uL (ref 4.0–10.5)

## 2021-04-03 LAB — HEMOGLOBIN A1C: Hgb A1c MFr Bld: 5.3 % (ref 4.6–6.5)

## 2021-04-03 NOTE — Progress Notes (Signed)
Subjective:  Patient ID: Alexander George, male    DOB: 1955/07/25  Age: 65 y.o. MRN: [id number]  CC:  Chief Complaint  Patient presents with   Annual Exam    Pt here for annual exam. Due for shingles, discuss having today     HPI Alexander George presents for   Annual physical exam.  Bilateral knee pain, discussed in July, component of patellar tendinitis and patellofemoral syndrome discussed at that time with treatment of we will obtain an gel topical if needed, Cho-Pat strap, quad strengthening. Better with strap and quad strengthening.    Depression screen [facility] 2/9 04/03/2021 12/28/2020 03/30/2020 10/30/2019 10/01/2019  Decreased Interest 0 0 0 0 0  Down, Depressed, Hopeless 0 0 0 0 0  PHQ - 2 Score 0 0 0 0 0  Altered sleeping 0 - - - -  Tired, decreased energy 1 - - - -  Change in appetite 0 - - - -  Feeling bad or failure about yourself  0 - - - -  Trouble concentrating 0 - - - -  Moving slowly or fidgety/restless 0 - - - -  Suicidal thoughts 0 - - - -  PHQ-9 Score 1 - - - -    Cancer screening Colonoscopy 01/31/2021 No FH of prostate cancer. The natural history of prostate cancer and ongoing controversy regarding screening and potential treatment outcomes of prostate cancer has been discussed with the patient. The meaning of a false positive PSA and a false negative PSA has been discussed. He indicates understanding of the limitations of this screening test and wishes to proceed with screening PSA testing.  Lab Results  Component Value Date   PSA1 1.0 03/30/2020   PSA1 0.9 02/02/2019   PSA1 0.7 01/22/2018   PSA 0.56 09/30/2014   Immunization History  Administered Date(s) Administered   Influenza,inj,Quad PF,6+ Mos 04/02/2013, 04/27/2015, 02/02/2019   PFIZER(Purple Top)SARS-COV-2 Vaccination 07/13/2019, 08/20/2019   Tdap 01/22/2018  Flu vaccine - declines.  COVID-19 vaccine booster - had last year.  Shingles vaccine today.   Vision Screening   Right eye Left eye  Both eyes  Without correction 20/40 20/25-1 20/25  With correction     Optho eval few days ago. Cataract. Close to repair.   Appt with ENT 11/4. Some high pitch tinnitus. Had hearing eval at [facility] - concern for dark spot and question of hole in era. Some difficulty hearing TV few months.   Dental: every 6 months - appt month ago.   Alcohol:none  Tobacco:none  Exercise:walking for exercise, daily. 3.70miles. 1 hour 25 min.      History Patient Active Problem List   Diagnosis Date Noted   Degeneration of lumbar or lumbosacral intervertebral disc 07/30/2013   OSA (obstructive sleep apnea) 03/16/2008   Past Medical History:  Diagnosis Date   Arthritis    very little in knees   Cataract    forming   GERD (gastroesophageal reflux disease)    Heart murmur    Neuromuscular disorder (HCC)    pinched nerve in neck, makes fingers numb in hand left- and also back pain   Seizures ([place]) 40 years ago    none since then per pt   Sleep apnea    CPAP    Past Surgical History:  Procedure Laterality Date   COLONOSCOPY  07/20/2005   Dr.Kaplan=normal exam , 01/31/21-Dr Danis at [place] Left    6-7 years ago  POLYPECTOMY     No Known Allergies Prior to Admission medications   Medication Sig Start Date End Date Taking? Authorizing Provider  betamethasone dipropionate 0.05 % lotion Apply 1 application topically 2 (two) times daily. Pt uses as needed. Patient not taking: No sig reported 04/20/20   [provider]  ibuprofen (ADVIL) 800 MG tablet Take 800 mg by mouth 3 (three) times daily. 05/16/20   [provider]  triamcinolone cream (KENALOG) 0.1 % Apply topically 2 (two) times daily as needed. Patient not taking: No sig reported 12/08/20   [provider]   Social History   Socioeconomic History   Marital status: Married    Spouse name: Not on file   Number of children: Not on file   Years of education: Not on file    Highest education level: Not on file  Occupational History   Not on file  Tobacco Use   Smoking status: Never   Smokeless tobacco: Never  Vaping Use   Vaping Use: Never used  Substance and Sexual Activity   Alcohol use: No   Drug use: No   Sexual activity: Not Currently  Other Topics Concern   Not on file  Social History Narrative   Right handed   Lives with wife in a two story home.   Social Determinants of Health   Financial Resource Strain: Not on file  Food Insecurity: Not on file  Transportation Needs: Not on file  Physical Activity: Not on file  Stress: Not on file  Social Connections: Not on file  Intimate Partner Violence: Not on file    Review of Systems   Objective:   Vitals:   04/03/21 0802  BP: 126/78  Pulse: (!) 54  Resp: 16  Temp: 98.2 F (36.8 C)  TempSrc: Temporal  SpO2: 98%  Weight: 162 lb 6.4 oz (73.7 kg)  Height: 5\' 8"  (1.727 m)     Physical Exam Vitals reviewed.  Constitutional:      Appearance: He is well-developed.  HENT:     Head: Normocephalic and atraumatic.     Right Ear: Ear canal and external ear normal.     Left Ear: Ear canal and external ear normal.     Ears:     Comments: Appears to have a slight effusion behind the right TM without obvious rupture. Eyes:     Conjunctiva/sclera: Conjunctivae normal.     Pupils: Pupils are equal, round, and reactive to light.  Neck:     Thyroid: No thyromegaly.  Cardiovascular:     Rate and Rhythm: Normal rate and regular rhythm.     Heart sounds: Normal heart sounds.  Pulmonary:     Effort: Pulmonary effort is normal. No respiratory distress.     Breath sounds: Normal breath sounds. No wheezing.  Abdominal:     General: There is no distension.     Palpations: Abdomen is soft.     Tenderness: There is no abdominal tenderness.  Musculoskeletal:        General: No tenderness. Normal range of motion.     Cervical back: Normal range of motion and neck supple.  Lymphadenopathy:      Cervical: No cervical adenopathy.  Skin:    General: Skin is warm and dry.  Neurological:     Mental Status: He is alert and oriented to person, place, and time.     Deep Tendon Reflexes: Reflexes are normal and symmetric.  Psychiatric:        Behavior:  Behavior normal.     Assessment & Plan:  Alexander George is a 65 y.o. male . Annual physical exam - Plan: Comprehensive metabolic panel, CBC with Differential/Platelet, Lipid panel, Hemoglobin A1c  - -anticipatory guidance as below in AVS, screening labs above. Health maintenance items as above in HPI discussed/recommended as applicable.   -Plan for evaluation at ENT as above next week for high-pitched tinnitus, intermittent difficulty with hearing out of the right ear and questionable abnormalities of the TM.  Recommended flu vaccine, declined, recommended COVID-19 bivalent booster. Screening for hyperlipidemia - Plan: Comprehensive metabolic panel, Lipid panel  Screening, anemia, deficiency, iron - Plan: CBC with Differential/Platelet  Screening for diabetes mellitus - Plan: Comprehensive metabolic panel, Hemoglobin A1c  Need for shingles vaccine - Plan: Varicella-zoster vaccine IM Potential side effects discussed, second dose in 2 to 6 months.  No orders of the defined types were placed in this encounter.  Patient Instructions  I do recommend flu vaccine. [facility] is now offering the bivalent Covid booster vaccine. To schedule an appointment or to find more information, visit [email or web address] You can also call [phone], Monday through Friday, 7 a.m. to 7 p.m. Shingle vaccine today, return for 2nd one in 2-6 months.   Good luck at ear specialist.  Thanks for coming in today and please let me know if there are questions.  Preventive Care 49-75 Years Old, Male Preventive care refers to lifestyle choices and visits with your health care provider that can promote health and wellness. This includes: A yearly physical  exam. This is also called an annual wellness visit. Regular dental and eye exams. Immunizations. Screening for certain conditions. Healthy lifestyle choices, such as: Eating a healthy diet. Getting regular exercise. Not using drugs or products that contain nicotine and tobacco. Limiting alcohol use. What can I expect for my preventive care visit? Physical exam Your health care provider will check your: Height and weight. These may be used to calculate your BMI (body mass index). BMI is a measurement that tells if you are at a healthy weight. Heart rate and blood pressure. Body temperature. Skin for abnormal spots. Counseling Your health care provider may ask you questions about your: Past medical problems. Family's medical history. Alcohol, tobacco, and drug use. Emotional well-being. Home life and relationship well-being. Sexual activity. Diet, exercise, and sleep habits. Work and work Statistician. Access to firearms. What immunizations do I need? Vaccines are usually given at various ages, according to a schedule. Your health care provider will recommend vaccines for you based on your age, medical history, and lifestyle or other factors, such as travel or where you work. What tests do I need? Blood tests Lipid and cholesterol levels. These may be checked every 5 years, or more often if you are over 29 years old. Hepatitis C test. Hepatitis B test. Screening Lung cancer screening. You may have this screening every year starting at age 33 if you have a 30-pack-year history of smoking and currently smoke or have quit within the past 15 years. Prostate cancer screening. Recommendations will vary depending on your family history and other risks. Genital exam to check for testicular cancer or hernias. Colorectal cancer screening. All adults should have this screening starting at age 9 and continuing until age 68. Your health care provider may recommend screening at age 38 if you  are at increased risk. You will have tests every 1-10 years, depending on your results and the type of screening test. Diabetes screening. This is done by checking  your blood sugar (glucose) after you have not eaten for a while (fasting). You may have this done every 1-3 years. STD (sexually transmitted disease) testing, if you are at risk. Follow these instructions at home: Eating and drinking  Eat a diet that includes fresh fruits and vegetables, whole grains, lean protein, and low-fat dairy products. Take vitamin and mineral supplements as recommended by your health care provider. Do not drink alcohol if your health care provider tells you not to drink. If you drink alcohol: Limit how much you have to 0-2 drinks a day. Be aware of how much alcohol is in your drink. In the [place] one drink equals one 12 oz bottle of beer (355 mL), one 5 oz glass of wine (148 mL), or one 1 oz glass of hard liquor (44 mL). Lifestyle Take daily care of your teeth and gums. Brush your teeth every morning and night with fluoride toothpaste. Floss one time each day. Stay active. Exercise for at least 30 minutes 5 or more days each week. Do not use any products that contain nicotine or tobacco, such as cigarettes, e-cigarettes, and chewing tobacco. If you need help quitting, ask your health care provider. Do not use drugs. If you are sexually active, practice safe sex. Use a condom or other form of protection to prevent STIs (sexually transmitted infections). If told by your health care provider, take low-dose aspirin daily starting at age 77. Find healthy ways to cope with stress, such as: Meditation, yoga, or listening to music. Journaling. Talking to a trusted person. Spending time with friends and family. Safety Always wear your seat belt while driving or riding in a vehicle. Do not drive: If you have been drinking alcohol. Do not ride with someone who has been drinking. When you are tired or  distracted. While texting. Wear a helmet and other protective equipment during sports activities. If you have firearms in your house, make sure you follow all gun safety procedures. What's next? Go to your health care provider once a year for an annual wellness visit. Ask your health care provider how often you should have your eyes and teeth checked. Stay up to date on all vaccines. This information is not intended to replace advice given to you by your health care provider. Make sure you discuss any questions you have with your health care provider. Document Revised: 08/05/2020 Document Reviewed: 05/22/2018 Elsevier Patient Education  2022 [place],   Merri Ray, MD [facility], [facility] 04/03/21 8:41 AM

## 2021-04-03 NOTE — Patient Instructions (Addendum)
I do recommend flu vaccine. [facility] is now offering the bivalent Covid booster vaccine. To schedule an appointment or to find more information, visit [email or web address] You can also call [phone], Monday through Friday, 7 a.m. to 7 p.m. Shingle vaccine today, return for 2nd one in 2-6 months.   Good luck at ear specialist.  Thanks for coming in today and please let me know if there are questions.  Preventive Care 48-63 Years Old, Male Preventive care refers to lifestyle choices and visits with your health care provider that can promote health and wellness. This includes: A yearly physical exam. This is also called an annual wellness visit. Regular dental and eye exams. Immunizations. Screening for certain conditions. Healthy lifestyle choices, such as: Eating a healthy diet. Getting regular exercise. Not using drugs or products that contain nicotine and tobacco. Limiting alcohol use. What can I expect for my preventive care visit? Physical exam Your health care provider will check your: Height and weight. These may be used to calculate your BMI (body mass index). BMI is a measurement that tells if you are at a healthy weight. Heart rate and blood pressure. Body temperature. Skin for abnormal spots. Counseling Your health care provider may ask you questions about your: Past medical problems. Family's medical history. Alcohol, tobacco, and drug use. Emotional well-being. Home life and relationship well-being. Sexual activity. Diet, exercise, and sleep habits. Work and work Statistician. Access to firearms. What immunizations do I need? Vaccines are usually given at various ages, according to a schedule. Your health care provider will recommend vaccines for you based on your age, medical history, and lifestyle or other factors, such as travel or where you work. What tests do I need? Blood tests Lipid and cholesterol levels. These may be checked every 5 years, or more  often if you are over 34 years old. Hepatitis C test. Hepatitis B test. Screening Lung cancer screening. You may have this screening every year starting at age 5 if you have a 30-pack-year history of smoking and currently smoke or have quit within the past 15 years. Prostate cancer screening. Recommendations will vary depending on your family history and other risks. Genital exam to check for testicular cancer or hernias. Colorectal cancer screening. All adults should have this screening starting at age 20 and continuing until age 65. Your health care provider may recommend screening at age 64 if you are at increased risk. You will have tests every 1-10 years, depending on your results and the type of screening test. Diabetes screening. This is done by checking your blood sugar (glucose) after you have not eaten for a while (fasting). You may have this done every 1-3 years. STD (sexually transmitted disease) testing, if you are at risk. Follow these instructions at home: Eating and drinking  Eat a diet that includes fresh fruits and vegetables, whole grains, lean protein, and low-fat dairy products. Take vitamin and mineral supplements as recommended by your health care provider. Do not drink alcohol if your health care provider tells you not to drink. If you drink alcohol: Limit how much you have to 0-2 drinks a day. Be aware of how much alcohol is in your drink. In the [place] one drink equals one 12 oz bottle of beer (355 mL), one 5 oz glass of wine (148 mL), or one 1 oz glass of hard liquor (44 mL). Lifestyle Take daily care of your teeth and gums. Brush your teeth every morning and night with fluoride toothpaste. Floss one time each  day. Stay active. Exercise for at least 30 minutes 5 or more days each week. Do not use any products that contain nicotine or tobacco, such as cigarettes, e-cigarettes, and chewing tobacco. If you need help quitting, ask your health care provider. Do not  use drugs. If you are sexually active, practice safe sex. Use a condom or other form of protection to prevent STIs (sexually transmitted infections). If told by your health care provider, take low-dose aspirin daily starting at age 71. Find healthy ways to cope with stress, such as: Meditation, yoga, or listening to music. Journaling. Talking to a trusted person. Spending time with friends and family. Safety Always wear your seat belt while driving or riding in a vehicle. Do not drive: If you have been drinking alcohol. Do not ride with someone who has been drinking. When you are tired or distracted. While texting. Wear a helmet and other protective equipment during sports activities. If you have firearms in your house, make sure you follow all gun safety procedures. What's next? Go to your health care provider once a year for an annual wellness visit. Ask your health care provider how often you should have your eyes and teeth checked. Stay up to date on all vaccines. This information is not intended to replace advice given to you by your health care provider. Make sure you discuss any questions you have with your health care provider. Document Revised: 08/05/2020 Document Reviewed: 05/22/2018 Elsevier Patient Education  2022 [employer].

## 2021-04-10 ENCOUNTER — Other Ambulatory Visit: Payer: Self-pay

## 2021-04-10 ENCOUNTER — Ambulatory Visit (INDEPENDENT_AMBULATORY_CARE_PROVIDER_SITE_OTHER): Payer: BC Managed Care – PPO | Admitting: Podiatry

## 2021-04-10 ENCOUNTER — Encounter: Payer: Self-pay | Admitting: Podiatry

## 2021-04-10 DIAGNOSIS — M76821 Posterior tibial tendinitis, right leg: Secondary | ICD-10-CM | POA: Diagnosis not present

## 2021-04-10 DIAGNOSIS — M2142 Flat foot [pes planus] (acquired), left foot: Secondary | ICD-10-CM

## 2021-04-10 DIAGNOSIS — M722 Plantar fascial fibromatosis: Secondary | ICD-10-CM | POA: Diagnosis not present

## 2021-04-10 DIAGNOSIS — M2141 Flat foot [pes planus] (acquired), right foot: Secondary | ICD-10-CM | POA: Diagnosis not present

## 2021-04-10 MED ORDER — METHYLPREDNISOLONE 4 MG PO TBPK: ORAL_TABLET | ORAL | 0 refills | Status: DC

## 2021-04-10 MED ORDER — MELOXICAM 15 MG PO TABS: 15.0000 mg | ORAL_TABLET | Freq: Every day | ORAL | 0 refills | Status: DC | PRN

## 2021-04-10 NOTE — Patient Instructions (Signed)
Posterior Tibial Tendon Tear Rehab Ask your health care provider which exercises are safe for you. Do exercises exactly as told by your health care provider and adjust them as directed. It is normal to feel mild stretching, pulling, tightness, or discomfort as you do these exercises. Stop right away if you feel sudden pain or your pain gets worse. Do not begin these exercises until told by your health care provider. Stretching and range-of-motion exercises These exercises warm up your muscles and joints and improve the movement and flexibility of your ankle. These exercises also help to relieve pain, numbness, and tingling. Gastroc stretch  Sit on the floor with your left / right leg extended. Loop a belt or towel around ball of your left / right foot. The ball of your foot is on the walking surface, right under your toes. Keep your left / right ankle and foot relaxed and keep your knee straight while you use the belt or towel to pull your foot and ankle toward you. You should feel a gentle stretch behind your calf or knee (gastrocnemius). Hold this position for __________ seconds. Repeat __________ times. Complete this exercise __________ times a day. Active ankle dorsiflexion and plantar flexion  Sit with your left / right knee straight or bent. Do not rest your foot on anything. Flex your left / right ankle to tilt the top of your foot toward your shin (dorsiflexion). Hold this position for __________ seconds. Point your toes downward to tilt the top of your foot away from your shin (plantar flexion). Hold this position for __________ seconds. Repeat __________ times with your knee straight and __________ times with your knee bent. Complete this exercise __________ times a day. Passive ankle plantar flexion  Sit with your left / right leg crossed over your opposite knee. With your left / right hand, pull the front of your foot and toes toward you (plantar flexion). You should feel a gentle  stretch on the top of your foot and ankle. Hold this position for __________ seconds. Repeat __________ times. Complete this exercise __________ times a day. Passive ankle eversion  Sit with your left / right ankle crossed over your opposite knee. With your left / right hand, hold your foot so that your thumb is on the top of your foot and your fingers are on the bottom of your foot. Gently push and twist your ankle downward (eversion) so the smallest toes rise slightly toward the ceiling. You should feel a gentle stretch on the inside of your ankle. Hold this stretch for __________ seconds. Repeat __________ times. Complete this exercise __________ times a day. Passive ankle inversion  Sit with your left / right ankle crossed over your opposite knee. With your left / right hand, hold your foot so that your thumb is on the bottom of your foot and your fingers are across the top of your foot. Gently pull and twist your foot so the smallest toe comes toward you (inversion). You should feel a gentle stretch on the outside of your ankle. Hold the stretch for __________ seconds. Repeat __________ times. Complete this exercise __________ times a day. Ankle alphabet  Sit with your left / right leg supported at the lower leg. Do not rest your foot on anything. Make sure your foot has room to move freely. Think of your left / right foot as a paintbrush, and move your foot to trace each letter of the alphabet in the air. Keep your hip and knee still while you   trace. Trace every letter of the alphabet. Repeat __________ times. Complete this exercise __________ times a day. Strengthening exercises These exercises build strength and endurance in your lower leg. Endurance is the ability to use your muscles for a long time, even after they get tired. Dorsiflexion  Secure a rubber exercise band or tube to an object that will not move if it is pulled on, such as a table leg. Secure the other end of the  band around your left / right foot. Sit on the floor, facing the object with your left / right leg extended. The band or tube should be slightly tense when your foot is relaxed. Slowly flex your left / right ankle and toes to bring your foot toward you (dorsiflexion). Hold this position for __________ seconds. Let the band or tube slowly pull your foot back to the starting position. Repeat __________ times. Complete this exercise __________ times a day. Plantar flexion while seated  Sit on the floor with your left / right leg extended. Loop a rubber exercise band or tube around the ball of your left / right foot. The ball of your foot is on the walking surface, right under your toes. The band or tube should be slightly tense when your foot is relaxed. Slowly point your toes downward, pushing them away from you (plantar flexion). Hold this position for __________ seconds. Let the band or tube slowly pull your foot back to the starting position. Repeat __________ times. Complete this exercise __________ times a day. Towel curls  Sit in a chair on a non-carpeted surface, and put your feet on the floor. Place a towel in front of your feet. If told by your health care provider, add __________ to the end of the towel. Keeping your heel on the floor, put your left / right foot on the towel. Pull the towel toward you by grabbing the towel with your toes and curling them under. Keep your heel on the floor. Repeat __________ times. Complete this exercise __________ times a day. This information is not intended to replace advice given to you by your health care provider. Make sure you discuss any questions you have with your health care provider. Document Revised: 09/19/2018 Document Reviewed: 07/16/2018 Elsevier Patient Education  [place].

## 2021-04-12 NOTE — Progress Notes (Signed)
Subjective: 65 year old male presents the office today for follow-up evaluation of posterior tibial tendinitis, memory discrepancy.  He has some modifications to the orthotics but he states he still having discomfort on the left side.  No recent injury or trauma he reports.  Does get some swelling to the medial aspect of the ankle.  He has been the worse over the last 2 months.  He has no other concerns today.  Objective: AAO x3, NAD DP/PT pulses palpable bilaterally, CRT less than 3 seconds There is a significant decrease in medial arch height upon weightbearing.  Majority tenderness is along the course the posterior tibial tendon posterior and inferior to the medial malleolus.  Mild discomfort of the arch of the foot as well.  No area pinpoint tenderness.  Ankle joint range of motion intact but any restrictions.  No pain with calf compression, swelling, warmth, erythema  Assessment: 65 year old male with posterior tibial tendon dysfunction, arch pain/plantar fasciitis  Plan: -All treatment options discussed with the patient including all alternatives, risks, complications.  -At this point given continuation of symptoms I recommend immobilization with a Tri-Lock ankle brace which was dispensed today.  Prescribed meloxicam.  Discussed icing daily.  As he starts to feel better we discussed stretching, rehab exercises that he can perform at home.  Discussed to be referred to physical therapy and starting his own at home.  Long-term if that shoe and the arch support would beneficial.  I did not modify the inserts today we will see how he is doing but we can further modify in the future if needed. -If symptoms persist recommend MRI. -Patient encouraged to call the office with any questions, concerns, change in symptoms.   Trula Slade DPM

## 2021-04-13 ENCOUNTER — Telehealth: Payer: Self-pay | Admitting: Family Medicine

## 2021-04-13 ENCOUNTER — Other Ambulatory Visit (HOSPITAL_COMMUNITY): Payer: Self-pay | Admitting: Otolaryngology

## 2021-04-13 ENCOUNTER — Other Ambulatory Visit: Payer: Self-pay | Admitting: Otolaryngology

## 2021-04-13 DIAGNOSIS — R1310 Dysphagia, unspecified: Secondary | ICD-10-CM

## 2021-04-13 NOTE — Telephone Encounter (Signed)
Called and spoke with patient about labs. Patient voiced understanding. Lab results have been emailed as patient requested.

## 2021-04-13 NOTE — Telephone Encounter (Signed)
..  Caller name:  Moriah Loughry  On DPR? :yes/no: Yes  Call back [phone]  Provider they see: Carlota Raspberry   Reason for call: Patient would like for someone to call him to go over his last lab results and would also like to be sent a copy of the results.

## 2021-04-20 ENCOUNTER — Ambulatory Visit (HOSPITAL_COMMUNITY): Payer: BC Managed Care – PPO

## 2021-04-26 ENCOUNTER — Ambulatory Visit (HOSPITAL_COMMUNITY)
Admission: RE | Admit: 2021-04-26 | Discharge: 2021-04-26 | Disposition: A | Discharge status: Home / Self Care | Payer: BC Managed Care – PPO | Source: Ambulatory Visit | Attending: Otolaryngology | Admitting: Otolaryngology

## 2021-04-26 DIAGNOSIS — R1310 Dysphagia, unspecified: Secondary | ICD-10-CM | POA: Insufficient documentation

## 2021-05-22 ENCOUNTER — Ambulatory Visit: Payer: BC Managed Care – PPO | Admitting: Podiatry

## 2021-07-24 ENCOUNTER — Ambulatory Visit: Payer: BC Managed Care – PPO | Admitting: Pulmonary Disease

## 2021-11-09 ENCOUNTER — Telehealth: Payer: BC Managed Care – PPO | Admitting: Family Medicine

## 2021-11-09 NOTE — Telephone Encounter (Signed)
I understand reason. [place] with transfer. Thanks.

## 2021-11-09 NOTE — Telephone Encounter (Signed)
Patient requests to [facility] from Dr. Carlota Raspberry to Dr. Cherlynn Kaiser.  The reason is office location closer to home.

## 2021-11-22 ENCOUNTER — Encounter: Payer: BC Managed Care – PPO | Admitting: Family Medicine

## 2021-11-22 NOTE — Telephone Encounter (Signed)
Tried to call Patient again. Phone# does not work. Letter was not sent out.

## 2021-11-23 ENCOUNTER — Ambulatory Visit (INDEPENDENT_AMBULATORY_CARE_PROVIDER_SITE_OTHER): Payer: Medicare Other | Admitting: Family Medicine

## 2021-11-23 ENCOUNTER — Encounter: Payer: Self-pay | Admitting: Family Medicine

## 2021-11-23 VITALS — BP 103/60 | HR 56 | Temp 98.0°F | Ht 68.0 in | Wt 165.1 lb

## 2021-11-23 DIAGNOSIS — E230 Hypopituitarism: Secondary | ICD-10-CM | POA: Insufficient documentation

## 2021-11-23 DIAGNOSIS — H9313 Tinnitus, bilateral: Secondary | ICD-10-CM | POA: Insufficient documentation

## 2021-11-23 DIAGNOSIS — G4733 Obstructive sleep apnea (adult) (pediatric): Secondary | ICD-10-CM | POA: Diagnosis not present

## 2021-11-23 DIAGNOSIS — Z0000 Encounter for general adult medical examination without abnormal findings: Secondary | ICD-10-CM

## 2021-11-23 DIAGNOSIS — N400 Enlarged prostate without lower urinary tract symptoms: Secondary | ICD-10-CM | POA: Insufficient documentation

## 2021-11-23 DIAGNOSIS — R1310 Dysphagia, unspecified: Secondary | ICD-10-CM | POA: Insufficient documentation

## 2021-11-23 DIAGNOSIS — N401 Enlarged prostate with lower urinary tract symptoms: Secondary | ICD-10-CM | POA: Insufficient documentation

## 2021-11-23 DIAGNOSIS — I8393 Asymptomatic varicose veins of bilateral lower extremities: Secondary | ICD-10-CM | POA: Diagnosis not present

## 2021-11-23 DIAGNOSIS — K219 Gastro-esophageal reflux disease without esophagitis: Secondary | ICD-10-CM | POA: Insufficient documentation

## 2021-11-23 DIAGNOSIS — Z862 Personal history of diseases of the blood and blood-forming organs and certain disorders involving the immune mechanism: Secondary | ICD-10-CM | POA: Insufficient documentation

## 2021-11-23 NOTE — Patient Instructions (Addendum)
Welcome to [facility]! It was a pleasure meeting you today.  As discussed, Please schedule a 12 month follow up visit today.  Azelastin-nasal for allergies  Electrolytes in water once/day.  Drink water throughout day.    Compression stockings  PLEASE NOTE:  If you had any LAB tests please let us know if you have not heard back within a few days. You may see your results on MyChart before we have a chance to review them but we will give you a call once they are reviewed by [place]. If we ordered any REFERRALS today, please let us know if you have not heard from their office within the next week.  Let us know through MyChart if you are needing REFILLS, or have your pharmacy send [place] the request. You can also use MyChart to communicate with me or any office staff.  Please try these tips to maintain a healthy lifestyle:  Eat most of your calories during the day when you are active. Eliminate processed foods including packaged sweets (pies, cakes, cookies), reduce intake of potatoes, white bread, white pasta, and white rice. Look for whole grain options, oat flour or almond flour.  Each meal should contain half fruits/vegetables, one quarter protein, and one quarter carbs (no bigger than a computer mouse).  Cut down on sweet beverages. This includes juice, soda, and sweet tea. Also watch fruit intake, though this is a healthier sweet option, it still contains natural sugar! Limit to 3 servings daily.  Drink at least 1 glass of water with each meal and aim for at least 8 glasses per day  Exercise at least 150 minutes every week.  It was very nice to see you today!     PLEASE NOTE:  If you had any lab tests please let us know if you have not heard back within a few days. You may see your results on MyChart before we have a chance to review them but we will give you a call once they are reviewed by [place]. If we ordered any referrals today, please let us know if you have  not heard from their office within the next week.   Please try these tips to maintain a healthy lifestyle:  Eat most of your calories during the day when you are active. Eliminate processed foods including packaged sweets (pies, cakes, cookies), reduce intake of potatoes, white bread, white pasta, and white rice. Look for whole grain options, oat flour or almond flour.  Each meal should contain half fruits/vegetables, one quarter protein, and one quarter carbs (no bigger than a computer mouse).  Cut down on sweet beverages. This includes juice, soda, and sweet tea. Also watch fruit intake, though this is a healthier sweet option, it still contains natural sugar! Limit to 3 servings daily.  Drink at least 1 glass of water with each meal and aim for at least 8 glasses per day  Exercise at least 150 minutes every week.

## 2021-11-23 NOTE — Progress Notes (Signed)
Phone: [phone]   Subjective:  Patient 66 y.o. male presenting for annual physical.  Chief Complaint  Patient presents with   Transfer of care   Dizziness    Pt c/o dizziness when he bends down and get back up. Present for 4-5 years. Happens mostly every time.    Annual physical-new to [facility]-needs referral now that he is w/[facility] to Dr. Elsworth Soho for continuing care   See problem oriented charting- ROS- ROS: Gen: no fever, chills  Skin: no rash, itching ENT: no ear pain, ear drainage, nasal congestion, rhinorrhea, sinus pressure, sore throat.  Congestion when doing yard work.   Tinnitis +-saw ENT Eyes: no blurry vision, double vision Resp: no cough, wheeze,SOB CV: no CP, palpitations, LE edema,  does have some varicose veins-no pain GI: no heartburn, n/v/d/c, abd pain GU: no dysuria, urgency, frequency, hematuria MSK: knees at times Neuro: no, headache, weakness, vertigo.   4-5 yrs of dizziness if squats long time or bending over and gets up..lasts few sec.   Psych: no depression, anxiety, insomnia, SI   Rash-intermitt-betamethasone intermitt  The following were reviewed and entered/updated in epic: Past Medical History:  Diagnosis Date   Arthritis    very little in knees   Cataract    forming   GERD (gastroesophageal reflux disease)    Heart murmur    Neuromuscular disorder (HCC)    pinched nerve in neck, makes fingers numb in hand left- and also back pain   Seizures ([place]) 40 years ago    none since then per pt   Sleep apnea    CPAP    Patient Active Problem List   Diagnosis Date Noted   Benign prostatic hyperplasia with lower urinary tract symptoms 11/23/2021   Dysphagia 11/23/2021   Enlarged prostate 11/23/2021   Gastroesophageal reflux disease 11/23/2021   History of polycythemia 11/23/2021   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   Asymptomatic varicose veins of both lower extremities 11/23/2021   Tinnitus of both ears 11/23/2021   Lumbar  spondylosis 07/04/2017   Degeneration of lumbar or lumbosacral intervertebral disc 07/30/2013   OSA (obstructive sleep apnea) 03/16/2008   Past Surgical History:  Procedure Laterality Date   COLONOSCOPY  07/20/2005   Dr.Kaplan=normal exam , 01/31/21-Dr Danis at [facility] Left    ing   KNEE SURGERY Left    6-7 years ago   POLYPECTOMY      Family History  Problem Relation Age of Onset   Diabetes Father    Cancer Sister        stomach   Stomach cancer Sister    Colon cancer Neg Hx    Esophageal cancer Neg Hx    Rectal cancer Neg Hx    Colon polyps Neg Hx     Medications- reviewed and updated Current Outpatient Medications  Medication Sig Dispense Refill   betamethasone dipropionate 0.05 % lotion Apply 1 application  topically 2 (two) times daily. Pt uses as needed.     No current facility-administered medications for this visit.    Allergies-reviewed and updated No Known Allergies  Social History   Social History Narrative   Right handed   Lives with wife in a two story home.   1 grand   Retired Electrical engineer   Objective  Objective:  BP 103/60 (BP Location: Left Arm, Patient Position: Sitting, Cuff Size: Large)   Pulse (!) 56   Temp 98 F (36.7 C) (Temporal)   Ht '5\' 8"'$  (1.727 m)  Wt 165 lb 2 oz (74.9 kg)   SpO2 98%   BMI 25.11 kg/m  Physical Exam Constitutional:      Appearance: Normal appearance.  HENT:     Head: Normocephalic and atraumatic.     Right Ear: Tympanic membrane, ear canal and external ear normal.     Left Ear: Tympanic membrane, ear canal and external ear normal.     Nose: Nose normal.     Mouth/Throat:     Mouth: Mucous membranes are moist.     Pharynx: Oropharynx is clear.  Eyes:     General: No scleral icterus.    Extraocular Movements: Extraocular movements intact.     Conjunctiva/sclera: Conjunctivae normal.     Pupils: Pupils are equal, round, and reactive to light.  Neck:     Vascular: No carotid bruit.   Cardiovascular:     Rate and Rhythm: Normal rate and regular rhythm.     Pulses: Normal pulses.     Heart sounds: No murmur heard. Pulmonary:     Effort: Pulmonary effort is normal.     Breath sounds: Normal breath sounds.  Abdominal:     General: Abdomen is flat. Bowel sounds are normal. There is no distension.     Palpations: Abdomen is soft. There is no mass.     Tenderness: There is no abdominal tenderness.  Musculoskeletal:     Cervical back: Neck supple.     Right lower leg: No edema.     Left lower leg: No edema.  Lymphadenopathy:     Cervical: No cervical adenopathy.  Neurological:     General: No focal deficit present.     Mental Status: He is alert and oriented to person, place, and time.     Cranial Nerves: No cranial nerve deficit.  Psychiatric:        Mood and Affect: Mood normal.     Mild vericose veins BLE    Assessment and Plan   Health Maintenance counseling: 1. Anticipatory guidance: Patient counseled regarding regular dental exams q6 months, eye exams yearly, avoiding smoking and second hand smoke, limiting alcohol to 2 beverages per day.   2. Risk factor reduction:  Advised patient of need for regular exercise and diet rich in fruits and vegetables to reduce risk of heart attack and stroke. Exercise- advised.   Wt Readings from Last 3 Encounters:  11/23/21 165 lb 2 oz (74.9 kg)  04/03/21 162 lb 6.4 oz (73.7 kg)  01/31/21 161 lb (73 kg)   3. Immunizations/screenings/ancillary studies Immunization History  Administered Date(s) Administered   Influenza Split 03/10/2011   Influenza,inj,Quad PF,6+ Mos 04/02/2013, 04/27/2015, 02/02/2019   PFIZER(Purple Top)SARS-COV-2 Vaccination 07/13/2019, 08/20/2019   Pfizer Covid-19 Vaccine Bivalent Booster 41yr & up 04/08/2020, 04/26/2021   Tdap 01/22/2018   Zoster Recombinat (Shingrix) 04/03/2021   Health Maintenance Due  Topic Date Due   Zoster Vaccines- Shingrix (2 of 2) 05/29/2021    4. Prostate cancer  screening >55yo - risk factors?  Lab Results  Component Value Date   PSA 0.56 09/30/2014    5. Colon cancer screening:utd 6. Skin cancer screening- Iadvised regular sunscreen use. Denies worrisome, changing, or new skin lesions.  7. Smoking associated screening (lung cancer screening, AAA screen 65-75, UA)- non smoker- 8. STD screening - n/a  Problem List Items Addressed This Visit       Cardiovascular and Mediastinum   Asymptomatic varicose veins of both lower extremities     Respiratory   OSA (obstructive sleep  apnea)     Other   Tinnitus of both ears   Other Visit Diagnoses     Wellness examination    -  Primary      Wellness-antic guidance.  Declines pneumovax.  Will bring copy of shingrix from [facility]-had both done.  Labs in Oct ok.  Explained extensive labs not covered under wellness for [facility].  Osa-chronic,  stable.  Requesting refer Kara Mead for continued care Varicose veins-compression stockings.  4.  Tinnitis,-needs new referral d/t ins to ENT dwight bates  Recommended follow up: annual Return in about 1 year (around 11/24/2022) for annual. Future Appointments  Date Time Provider [place]  01/09/2022  1:30 PM Rigoberto Noel, MD [facility] None  11/27/2022  8:00 AM Tawnya Crook, MD [facility]     Lab/Order associations:n/a fasting   ICD-10-CM   1. Wellness examination  Z00.00     2. Asymptomatic varicose veins of both lower extremities  I83.93     3. OSA (obstructive sleep apnea)  G47.33     4. Tinnitus of both ears  H93.13       No orders of the defined types were placed in this encounter.    Wellington Hampshire, MD

## 2022-01-09 ENCOUNTER — Encounter: Payer: Self-pay | Admitting: Pulmonary Disease

## 2022-01-09 ENCOUNTER — Ambulatory Visit: Payer: Medicare Other | Admitting: Pulmonary Disease

## 2022-01-09 DIAGNOSIS — G4733 Obstructive sleep apnea (adult) (pediatric): Secondary | ICD-10-CM | POA: Diagnosis not present

## 2022-01-09 NOTE — Patient Instructions (Signed)
CPAP works well when used.  We discussed inspire implant for sleep apnea. Discussed with Dr. Redmond Baseman when you see him

## 2022-01-09 NOTE — Assessment & Plan Note (Signed)
CPAP download was reviewed which shows excellent control of events on auto settings 5 to 12 cm with average pressure of 9 and max pressure of 10 cm, there is mild leak. He is compliant with average usage of 4.5 hours per night. CPAP is helped improve his daytime somnolence and fatigue.  CPAP supplies will be renewed for a year  I discussed alternative therapy including hypoglossal nerve stimulator implantation .  He was hesitant but will consider.  He has an upcoming appointment with Dr. Redmond Baseman for tinnitus and he will discuss

## 2022-01-09 NOTE — Progress Notes (Signed)
   Subjective:    Patient ID: Alexander George, male    DOB: 20-Jun-1955, 66 y.o.   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 He has settled down with nasal mask.  He wakes up around 5 AM and has a sprayer and is unable to fall asleep again for an hour.  This is the most difficult time with the CPAP and many at times he will just not use the machine after this time. Overall he feels the machine is helped improve his daytime somnolence and fatigue.  Wife has not witnessed apneas or snoring. He does use a mouthguard for teeth grinding  He is seeing ENT for tinnitus  Significant tests/ events reviewed  06/2001 NPSG   RDI was 21 with a REM RDI of 57.  His oxygen nadir was 76%.   HST 08/2019 severe OSA, AHI 30/hour   coronary CT showed RA/RV dilatation. 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 Cardiac MRI on 03/03/2020 showed QP/QS 1.1, suggesting no significant shunt, mild RV dilatation with normal systolic function (EF 550%, normal LV size and systolic function (EF 509%, mild TR/PI/AI.    Zio patch x14 days on 04/25/2020 showed 13 episodes of SVT, longest lasting 20 beats.  Review of Systems neg for any significant sore throat, dysphagia, itching, sneezing, nasal congestion or excess/ purulent secretions, fever, chills, sweats, unintended wt loss, pleuritic or exertional cp, hempoptysis, orthopnea pnd or change in chronic leg swelling. Also denies presyncope, palpitations, heartburn, abdominal pain, nausea, vomiting, diarrhea or change in bowel or urinary habits, dysuria,hematuria, rash, arthralgias, visual complaints, headache, numbness weakness or ataxia.     Objective:   Physical Exam  Gen. Pleasant, well-nourished, in no distress ENT - no thrush, no pallor/icterus,no post nasal drip Neck: No JVD, no thyromegaly, no carotid bruits Lungs: no use of accessory muscles, no  dullness to percussion, clear without rales or rhonchi  Cardiovascular: Rhythm regular, heart sounds  normal, no murmurs or gallops, no peripheral edema Musculoskeletal: No deformities, no cyanosis or clubbing        Assessment & Plan:

## 2022-01-16 DIAGNOSIS — H903 Sensorineural hearing loss, bilateral: Secondary | ICD-10-CM | POA: Diagnosis not present

## 2022-01-18 DIAGNOSIS — H903 Sensorineural hearing loss, bilateral: Secondary | ICD-10-CM | POA: Diagnosis not present

## 2022-01-18 DIAGNOSIS — H9313 Tinnitus, bilateral: Secondary | ICD-10-CM | POA: Diagnosis not present

## 2022-05-17 DIAGNOSIS — H9313 Tinnitus, bilateral: Secondary | ICD-10-CM | POA: Diagnosis not present

## 2022-05-17 DIAGNOSIS — H903 Sensorineural hearing loss, bilateral: Secondary | ICD-10-CM | POA: Diagnosis not present

## 2022-07-18 ENCOUNTER — Ambulatory Visit (HOSPITAL_BASED_OUTPATIENT_CLINIC_OR_DEPARTMENT_OTHER)
Admission: RE | Admit: 2022-07-18 | Discharge: 2022-07-18 | Disposition: A | Discharge status: Home / Self Care | Payer: Medicare Other | Source: Ambulatory Visit | Attending: Family Medicine | Admitting: Family Medicine

## 2022-07-18 ENCOUNTER — Ambulatory Visit (INDEPENDENT_AMBULATORY_CARE_PROVIDER_SITE_OTHER): Payer: Medicare Other | Admitting: Family Medicine

## 2022-07-18 ENCOUNTER — Encounter: Payer: Self-pay | Admitting: Family Medicine

## 2022-07-18 VITALS — BP 112/60 | HR 81 | Temp 98.5°F | Ht 68.0 in | Wt 172.1 lb

## 2022-07-18 DIAGNOSIS — R39198 Other difficulties with micturition: Secondary | ICD-10-CM | POA: Diagnosis not present

## 2022-07-18 DIAGNOSIS — N50812 Left testicular pain: Secondary | ICD-10-CM

## 2022-07-18 LAB — POCT URINALYSIS DIPSTICK
Bilirubin, UA: NEGATIVE
Blood, UA: NEGATIVE
Glucose, UA: NEGATIVE
Ketones, UA: NEGATIVE
Leukocytes, UA: NEGATIVE
Nitrite, UA: NEGATIVE
Protein, UA: NEGATIVE
Spec Grav, UA: <=1.005 — AB (ref 1.010–1.025)
Urobilinogen, UA: 0.2 E.U./dL
pH, UA: 6 (ref 5.0–8.0)

## 2022-07-18 LAB — CBC WITH DIFFERENTIAL/PLATELET
Basophils Absolute: 0 10*3/uL (ref 0.0–0.1)
Basophils Relative: 1 % (ref 0.0–3.0)
Eosinophils Absolute: 0.2 10*3/uL (ref 0.0–0.7)
Eosinophils Relative: 4.7 % (ref 0.0–5.0)
HCT: 42.1 % (ref 39.0–52.0)
Hemoglobin: 14.2 g/dL (ref 13.0–17.0)
Lymphocytes Relative: 39.2 % (ref 12.0–46.0)
Lymphs Abs: 1.8 10*3/uL (ref 0.7–4.0)
MCHC: 33.7 g/dL (ref 30.0–36.0)
MCV: 88.2 fl (ref 78.0–100.0)
Monocytes Absolute: 0.4 10*3/uL (ref 0.1–1.0)
Monocytes Relative: 7.8 % (ref 3.0–12.0)
Neutro Abs: 2.1 10*3/uL (ref 1.4–7.7)
Neutrophils Relative %: 47.3 % (ref 43.0–77.0)
Platelets: 185 10*3/uL (ref 150.0–400.0)
RBC: 4.77 Mil/uL (ref 4.22–5.81)
RDW: 14.9 % (ref 11.5–15.5)
WBC: 4.5 10*3/uL (ref 4.0–10.5)

## 2022-07-18 LAB — COMPREHENSIVE METABOLIC PANEL
ALT: 13 U/L (ref 0–53)
AST: 17 U/L (ref 0–37)
Albumin: 4.7 g/dL (ref 3.5–5.2)
Alkaline Phosphatase: 38 U/L — ABNORMAL LOW (ref 39–117)
BUN: 17 mg/dL (ref 6–23)
CO2: 28 mEq/L (ref 19–32)
Calcium: 9.1 mg/dL (ref 8.4–10.5)
Chloride: 104 mEq/L (ref 96–112)
Creatinine, Ser: 0.83 mg/dL (ref 0.40–1.50)
GFR: 91.46 mL/min (ref >60.00)
Glucose, Bld: 97 mg/dL (ref 70–99)
Potassium: 4 mEq/L (ref 3.5–5.1)
Sodium: 140 mEq/L (ref 135–145)
Total Bilirubin: 0.8 mg/dL (ref 0.2–1.2)
Total Protein: 7.4 g/dL (ref 6.0–8.3)

## 2022-07-18 LAB — PSA: PSA: 1.02 ng/mL (ref 0.10–4.00)

## 2022-07-18 MED ORDER — CIPROFLOXACIN HCL 500 MG PO TABS: 500.0000 mg | ORAL_TABLET | Freq: Two times a day (BID) | ORAL | 0 refills | Status: AC

## 2022-07-18 MED ORDER — TAMSULOSIN HCL 0.4 MG PO CAPS: 0.4000 mg | ORAL_CAPSULE | Freq: Every day | ORAL | 3 refills | Status: DC

## 2022-07-18 NOTE — Patient Instructions (Signed)
Setting up the ultrasound Doing labs  Take the cipro as directed-antibiotic Tamsulosin-may take weeks to help the urine  Will know more as results come in  Worse, let us know  Let me know Monday-how you are doing

## 2022-07-18 NOTE — Progress Notes (Signed)
Subjective:     Patient ID: Alexander George, male    DOB: [birth year], 67 y.o.   MRN: [id number]  Chief Complaint  Patient presents with   Testicle Pain    Testicle pain that started 1 week ago, left testicle, comes and goes, worse at times, radiates at times    HPI 1 wk intermitt L testicle pain-2 wks ago.  2 days ago, more tender-yesterday, like "someone hit you"-lasted sev seconds.  Still some "traces" pain.  More intermitt.  No change w/activity.  Pt not wear underware at home.   No injury.  63yr ago, hernia surgery on L.   Pain rad to upper abd and L upper thigh.   No dysuria, no freq.  At times, not emptying all the way.  No change in stream.  No f/c -son had pain and swelling-saw doc-CT done and removed-dx testicular ca.  Health Maintenance Due  Topic Date Due   Medicare Annual Wellness (AWV)  Never done    Past Medical History:  Diagnosis Date   Arthritis    very little in knees   Cataract    forming   GERD (gastroesophageal reflux disease)    Heart murmur    Neuromuscular disorder (HCC)    pinched nerve in neck, makes fingers numb in hand left- and also back pain   [place] since then per pt   Sleep apnea    CPAP     Past Surgical History:  Procedure Laterality Date   COLONOSCOPY  07/20/2005   Dr.Kaplan=[facility] exam , [facility]    [facility]   KNEE SURGERY Left    6-7 years ago   POLYPECTOMY      Outpatient Medications Prior to Visit  Medication Sig Dispense Refill   mometasone (ELOCON) 0.1 % cream Apply 1 Application topically daily.     betamethasone dipropionate 0.05 % lotion Apply 1 application  topically 2 (two) times daily. Pt uses as needed. (Patient not taking: Reported on 01/09/2022)     No facility-administered medications prior to visit.    Allergies  Allergen Reactions   Androgens     Other Reaction(s): local rash, patch   ROS neg/noncontributory except as noted HPI/below       Objective:     BP 112/60   Pulse 81   Temp 98.5 F (36.9 C) (Temporal)   Ht '5\' 8"'$  (1.727 m)   Wt 172 lb 2 oz (78.1 kg)   SpO2 99%   BMI 26.17 kg/m  Wt Readings from Last 3 Encounters:  07/18/22 172 lb 2 oz (78.1 kg)  01/09/22 164 lb 9.6 oz (74.7 kg)  11/23/21 165 lb 2 oz (74.9 kg)    Physical Exam   Gen: WDWN NAD HEENT: NCAT, conjunctiva not injected, sclera nonicteric NECK:  supple, no thyromegaly, no nodes, no carotid bruits CARDIAC: RRR, S1S2+, no murmur. DP 2+B LUNGS: CTAB. No wheezes ABDOMEN:  BS+, soft, NTND, No HSM, no masses.  When pressing on RLQ, pt felt like had to urinate(did just prior to exam).  No CVAT EXT:  no edema MSK: no gross abnormalities. No TTP spine, SI.  NEURO: A&O x3.  CN II-XII intact.  PSYCH: normal mood. Good eye contact GU-chaperone QJ present-circ male.  Testes WNL B. No TTP, no inq hernia.  No nodes.   Pt tender and tightened rectum so hard to feel prostate.   Results for orders placed or  performed in visit on 07/18/22  POCT urinalysis dipstick  Result Value Ref Range   Color, UA YELLOW    Clarity, UA CLEAR    Glucose, UA Negative Negative   Bilirubin, UA NEG    Ketones, UA NEG    Spec Grav, UA <=1.005 (A) 1.010 - 1.025   Blood, UA NEG    pH, UA 6.0 5.0 - 8.0   Protein, UA Negative Negative   Urobilinogen, UA 0.2 0.2 or 1.0 E.U./dL   Nitrite, UA NEG    Leukocytes, UA Negative Negative   Appearance     Odor          Assessment & Plan:   Problem List Items Addressed This Visit   None Visit Diagnoses     Pain in left testicle    -  Primary   Relevant Orders   POCT urinalysis dipstick (Completed)   Urine Culture   US Scrotum   Decreased urine stream       Relevant Orders   Urine Culture   PSA   Comprehensive metabolic panel   CBC with Differential/Platelet      L testicular pain-don't feel it is torsion.  Prob more strain/poss prostatitis/infection, other.  Will check u/s-pt very anxious d/t son w/ca.  Will check  cx.   Urinary hesitancy-suspect either prostatitis, BPH, poss other.  Will check cx, cbc,cmp(make sure creat not high), psa.  Tamsulosin 0.'4mg'$  daily.  Cipro '500mg'$  bid x 10d.  Call on Monday w/progress.  Worse, let us know or ER.   Meds ordered this encounter  Medications   tamsulosin (FLOMAX) 0.4 MG CAPS capsule    Sig: Take 1 capsule (0.4 mg total) by mouth daily.    Dispense:  30 capsule    Refill:  3   ciprofloxacin (CIPRO) 500 MG tablet    Sig: Take 1 tablet (500 mg total) by mouth 2 (two) times daily for 10 days.    Dispense:  20 tablet    Refill:  0    Wellington Hampshire, MD

## 2022-07-19 LAB — URINE CULTURE
MICRO NUMBER:: 14532556
Result:: NO GROWTH
SPECIMEN QUALITY:: ADEQUATE

## 2022-08-14 ENCOUNTER — Telehealth: Payer: Self-pay | Admitting: Family Medicine

## 2022-08-14 NOTE — Telephone Encounter (Signed)
Copied from [place] [id number]. Topic: Medicare AWV >> Aug 14, 2022 10:26 AM Gillis Santa wrote: Reason for CRM: Called patient to schedule Medicare Annual Wellness Visit (AWV). Left message for patient to call back and schedule Medicare Annual Wellness Visit (AWV).  Last date of AWV: N/A  Please schedule an appointment at any time with Otila Kluver, [facility].  If any questions, please contact me at [phone].  Thank you ,  Shaune Pollack [facility] AWV TEAM Direct Dial [phone]

## 2022-08-14 NOTE — Telephone Encounter (Signed)
Contacted Alexander George to schedule their annual wellness visit. Patient declined to schedule AWV at this time.  Patient does not want to participate.  [place] Direct Dial [phone]

## 2022-08-20 DIAGNOSIS — R3912 Poor urinary stream: Secondary | ICD-10-CM | POA: Diagnosis not present

## 2022-09-11 ENCOUNTER — Ambulatory Visit: Payer: Medicare Other | Admitting: Family Medicine

## 2022-10-13 ENCOUNTER — Other Ambulatory Visit: Payer: Self-pay | Admitting: Family Medicine

## 2022-10-29 ENCOUNTER — Ambulatory Visit (INDEPENDENT_AMBULATORY_CARE_PROVIDER_SITE_OTHER): Payer: Medicare Other | Admitting: Family Medicine

## 2022-10-29 ENCOUNTER — Encounter: Payer: Self-pay | Admitting: Family Medicine

## 2022-10-29 VITALS — BP 120/70 | HR 66 | Temp 98.3°F | Resp 18 | Ht 68.0 in | Wt 168.4 lb

## 2022-10-29 DIAGNOSIS — M79674 Pain in right toe(s): Secondary | ICD-10-CM

## 2022-10-29 NOTE — Progress Notes (Signed)
   Subjective:     Patient ID: Alexander George, male    DOB: 08/16/1955, 67 y.o.   MRN: [id number]  Chief Complaint  Patient presents with   Foot Pain    Right foot pain x 1 month, hit with an iron bar, still having pain, especially when walking     HPI 1 month(s) ago-walking in dark and ran into metal bar w/right foot.  Still w/pain.  When shoe narrower, pain worse.  No pain at rest. When hits a "certain position" when walking, click and then pain.  No n/to.  Right great toe.   Health Maintenance Due  Topic Date Due   Medicare Annual Wellness (AWV)  Never done    Past Medical History:  Diagnosis Date   Arthritis    very little in knees   Cataract    forming   GERD (gastroesophageal reflux disease)    Heart murmur    Neuromuscular disorder (HCC)    pinched nerve in neck, makes fingers numb in hand left- and also back pain   Seizures (HCC) 40 years ago    none since then per pt   Sleep apnea    CPAP     Past Surgical History:  Procedure Laterality Date   COLONOSCOPY  07/20/2005   Dr.Kaplan=normal exam , 01/31/21-Dr [facility]is at [facility]   HERNIA REPAIR Left    ing   KNEE SURGERY Left    6-7 years ago   POLYPECTOMY      No current outpatient medications on file.  Allergies  Allergen Reactions   Androgens     Other Reaction(s): local rash, patch   ROS neg/noncontributory except as noted HPI/below      Objective:     BP 120/70   Pulse 66   Temp 98.3 F (36.8 C) (Temporal)   Resp 18   Ht 5\' 8"  (1.727 m)   Wt 168 lb 6 oz (76.4 kg)   SpO2 97%   BMI 25.60 kg/m  Wt Readings from Last 3 Encounters:  10/29/22 168 lb 6 oz (76.4 kg)  07/18/22 172 lb 2 oz (78.1 kg)  01/09/22 164 lb 9.6 oz (74.7 kg)    Physical Exam   Gen: WDWN NAD HEENT: NCAT, conjunctiva not injected, sclera nonicteric EXT:  no edema MSK: no gross abnormalities.  No TTP right great toe, metatarsal.  Does have bunion deformity.  Some slight tenderness when I move toe medially.  Strength good.   NEURO: A&O x3.  CN II-XII intact.  PSYCH: normal mood. Good eye contact     Assessment & Plan:  Pain of toe of right foot -     DG Toe Great Right; Future  Pain right great toe after running into metal object-? Healing fracture, ?awareness of bunion, other.  Will check x-ray vs patient seeing sports medicine-infor given for both.  Angelena Sole, MD

## 2022-10-29 NOTE — Patient Instructions (Signed)
get X-ray/labs at [facility].  [address]  hours 8=M-F 8:30-5.  closed 12:30-1 lunch   Psychiatrist Medicine at [facility]  [address] on the 1st floor Phone number [phone]

## 2022-10-30 ENCOUNTER — Ambulatory Visit (HOSPITAL_BASED_OUTPATIENT_CLINIC_OR_DEPARTMENT_OTHER)
Admission: RE | Admit: 2022-10-30 | Discharge: 2022-10-30 | Disposition: A | Discharge status: Home / Self Care | Payer: Medicare Other | Source: Ambulatory Visit | Attending: Family Medicine | Admitting: Family Medicine

## 2022-10-30 DIAGNOSIS — M79674 Pain in right toe(s): Secondary | ICD-10-CM

## 2022-11-01 ENCOUNTER — Telehealth: Payer: Self-pay | Admitting: Family Medicine

## 2022-11-01 NOTE — Telephone Encounter (Signed)
Patient returned call for his imaging results. Pt requests callback when able.

## 2022-11-02 NOTE — Telephone Encounter (Signed)
Patient returned call, went over imaging results and recommendations.

## 2022-11-02 NOTE — Telephone Encounter (Signed)
Left message to return call 

## 2022-11-27 ENCOUNTER — Encounter: Payer: Medicare Other | Admitting: Family Medicine

## 2022-12-05 ENCOUNTER — Encounter: Payer: Self-pay | Admitting: Family Medicine

## 2022-12-05 ENCOUNTER — Ambulatory Visit (INDEPENDENT_AMBULATORY_CARE_PROVIDER_SITE_OTHER): Payer: Medicare Other | Admitting: Family Medicine

## 2022-12-05 VITALS — BP 120/70 | HR 56 | Temp 98.1°F | Resp 16 | Ht 68.0 in | Wt 167.1 lb

## 2022-12-05 DIAGNOSIS — Z0000 Encounter for general adult medical examination without abnormal findings: Secondary | ICD-10-CM

## 2022-12-05 DIAGNOSIS — Z131 Encounter for screening for diabetes mellitus: Secondary | ICD-10-CM | POA: Insufficient documentation

## 2022-12-05 NOTE — Patient Instructions (Signed)
It was very nice to see you today!  Get the dates from [facility] for shingles shots.   PLEASE NOTE:  If you had any lab tests please let us know if you have not heard back within a few days. You may see your results on MyChart before we have a chance to review them but we will give you a call once they are reviewed by [place]. If we ordered any referrals today, please let us know if you have not heard from their office within the next week.   Please try these tips to maintain a healthy lifestyle:  Eat most of your calories during the day when you are active. Eliminate processed foods including packaged sweets (pies, cakes, cookies), reduce intake of potatoes, white bread, white pasta, and white rice. Look for whole grain options, oat flour or almond flour.  Each meal should contain half fruits/vegetables, one quarter protein, and one quarter carbs (no bigger than a computer mouse).  Cut down on sweet beverages. This includes juice, soda, and sweet tea. Also watch fruit intake, though this is a healthier sweet option, it still contains natural sugar! Limit to 3 servings daily.  Drink at least 1 glass of water with each meal and aim for at least 8 glasses per day  Exercise at least 150 minutes every week.

## 2022-12-05 NOTE — Progress Notes (Signed)
Phone: [phone]   Subjective:  Patient 67 y.o. male presenting for annual physical.  Chief Complaint  Patient presents with   Annual Exam    CPE Fasting    Annual-walks.  No junk food. OSA-wearing CPAP.  Seeing Dr. Vassie Loll  See problem oriented charting- ROS- ROS: Gen: no fever, chills  Skin: no rash, itching ENT: no ear pain, ear drainage, nasal congestion, rhinorrhea, sinus pressure, sore throat Eyes: no blurry vision, double vision Resp: no cough, wheeze,SOB CV: no CP, palpitations, LE edema,  GI: no heartburn, n/v/d/c, abd pain GU: no dysuria, urgency, frequency, hematuria MSK: no joint pain, myalgias, back pain Neuro: no dizziness, headache, weakness, vertigo Psych: no depression, anxiety, insomnia, SI   The following were reviewed and entered/updated in epic: Past Medical History:  Diagnosis Date   Arthritis    very little in knees   Cataract    forming   GERD (gastroesophageal reflux disease)    Heart murmur    Neuromuscular disorder (HCC)    pinched nerve in neck, makes fingers numb in hand left- and also back pain   Seizures (HCC) 40 years ago    none since then per pt   Sleep apnea    CPAP    Patient Active Problem List   Diagnosis Date Noted   Screening for diabetes mellitus 12/05/2022   Benign prostatic hyperplasia with lower urinary tract symptoms 11/23/2021   Dysphagia 11/23/2021   Enlarged prostate 11/23/2021   Gastroesophageal reflux disease 11/23/2021   History of polycythemia 11/23/2021   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   Asymptomatic varicose veins of both lower extremities 11/23/2021   Tinnitus of both ears 11/23/2021   Lumbar spondylosis 07/04/2017   Degeneration of lumbar or lumbosacral intervertebral disc 07/30/2013   OSA (obstructive sleep apnea) 03/16/2008   Past Surgical History:  Procedure Laterality Date   COLONOSCOPY  07/20/2005   Dr.Kaplan=normal exam , 01/31/21-Dr Danis at [facility]   HERNIA REPAIR Left    ing    KNEE SURGERY Left    6-7 years ago   POLYPECTOMY      Family History  Problem Relation Age of Onset   Diabetes Father    Cancer Sister        stomach   Stomach cancer Sister    Colon cancer Neg Hx    Esophageal cancer Neg Hx    Rectal cancer Neg Hx    Colon polyps Neg Hx     Medications- reviewed and updated No current outpatient medications on file.   No current facility-administered medications for this visit.    Allergies-reviewed and updated Allergies  Allergen Reactions   Androgens     Other Reaction(s): local rash, patch    Social History   Social History Narrative   Right handed   Lives with wife in a two story home.   1 grand   Retired Retail banker   Objective  Objective:  BP 120/70   Pulse (!) 56   Temp 98.1 F (36.7 C) (Temporal)   Resp 16   Ht 5\' 8"  (1.727 m)   Wt 167 lb 2 oz (75.8 kg)   SpO2 96%   BMI 25.41 kg/m  Physical Exam  Gen: WDWN NAD HEENT: NCAT, conjunctiva not injected, sclera nonicteric TM WNL B, OP moist, no exudates  NECK:  supple, no thyromegaly, no nodes, no carotid bruits CARDIAC: RRR, S1S2+, no murmur. DP 2+B LUNGS: CTAB. No wheezes ABDOMEN:  BS+, soft, NTND, No HSM, no masses EXT:  no  edema MSK: no gross abnormalities. MS 5/5 all 4 NEURO: A&O x3.  CN II-XII intact.  PSYCH: normal mood. Good eye contact   Discussed previous labs   Assessment and Plan   Health Maintenance counseling: 1. Anticipatory guidance: Patient counseled regarding regular dental exams q6 months, eye exams yearly, avoiding smoking and second hand smoke, limiting alcohol to 2 beverages per day.   2. Risk factor reduction:  Advised patient of need for regular exercise and diet rich in fruits and vegetables to reduce risk of heart attack and stroke. Exercise- +.   Wt Readings from Last 3 Encounters:  12/05/22 167 lb 2 oz (75.8 kg)  10/29/22 168 lb 6 oz (76.4 kg)  07/18/22 172 lb 2 oz (78.1 kg)   3. Immunizations/screenings/ancillary  studies Immunization History  Administered Date(s) Administered   Influenza Split 03/10/2011   Influenza,inj,Quad PF,6+ Mos 04/02/2013, 04/27/2015, 02/02/2019   PFIZER(Purple Top)SARS-COV-2 Vaccination 07/13/2019, 08/20/2019   Pfizer Covid-19 Vaccine Bivalent Booster 23yrs & up 04/08/2020, 04/26/2021   Tdap 01/22/2018   Zoster Recombinat (Shingrix) 04/03/2021   Health Maintenance Due  Topic Date Due   Medicare Annual Wellness (AWV)  Never done    4. Prostate cancer screening >55yo - risk factors?  Lab Results  Component Value Date   PSA 1.02 07/18/2022   PSA 0.56 09/30/2014    5. Colon cancer screening:utd 6. Skin cancer screening- Iadvised regular sunscreen use. Denies worrisome, changing, or new skin lesions.  7. Smoking associated screening (lung cancer screening, AAA screen 65-75, UA)- never  Wellness examination   Wellness-antic guidance.    Recommended follow up: Return in about 1 year (around 12/05/2023) for annual physical.  Lab/Order associations:+ fasting   Angelena Sole, MD

## 2022-12-25 ENCOUNTER — Ambulatory Visit (INDEPENDENT_AMBULATORY_CARE_PROVIDER_SITE_OTHER): Payer: Medicare Other

## 2022-12-25 ENCOUNTER — Ambulatory Visit: Payer: Medicare Other | Admitting: Podiatry

## 2022-12-25 DIAGNOSIS — M79671 Pain in right foot: Secondary | ICD-10-CM | POA: Diagnosis not present

## 2022-12-25 DIAGNOSIS — M21619 Bunion of unspecified foot: Secondary | ICD-10-CM

## 2022-12-25 DIAGNOSIS — M7751 Other enthesopathy of right foot: Secondary | ICD-10-CM | POA: Diagnosis not present

## 2022-12-25 NOTE — Patient Instructions (Signed)
You can look for a [facility] SLEEVE You can also use Voltaren gel instead of oral medication if you would like to the area

## 2022-12-25 NOTE — Progress Notes (Signed)
Subjective: Chief Complaint  Patient presents with   Foot Injury    Patient came in today for a right foot injury, hallux, started 3 months ago, seen at PCP and took X-Rays at that time, rate of pain 10 out of 10 when having pain,    67 year old male presents the office for above concerns.  He states he injured this about 3 months ago and since he is not having pain points along the first IPJ.  He states that prior to the injury he did not have a bump there but now he does.  He was given anti-inflammatories by his primary care doctor but he does not like to take medication so therefore he did not take it.  States it hurts to bend the toe off at times.  No other treatment.  No other concerns.  Objective: AAO x3, NAD DP/PT pulses palpable bilaterally, CRT less than 3 seconds Mild to moderate bunion present on the right foot there is tenderness palpation along the medial eminence of first metatarsal, first IPJ but there is no area of point tenderness.  There are some slight edema there is no erythema or warmth.  No crepitation with MPJ range of motion. No pain with calf compression, swelling, warmth, erythema  Assessment: Capsulitis right first MTPJ  Plan: -All treatment options discussed with the patient including all alternatives, risks, complications.  -X-rays obtained reviewed.  3 views of the foot were obtained.  Also compared to prior x-rays.  Mild bunion present.  No evidence of acute fracture noted. -He does not would pursue oral medication.  Discussed Voltaren gel.  Has been cycling back.  Discussed medications avoid excess pressure. -Patient encouraged to call the office with any questions, concerns, change in symptoms.   Vivi Barrack DPM

## 2022-12-27 ENCOUNTER — Other Ambulatory Visit (HOSPITAL_BASED_OUTPATIENT_CLINIC_OR_DEPARTMENT_OTHER): Payer: Self-pay

## 2022-12-27 ENCOUNTER — Telehealth: Payer: Self-pay | Admitting: Podiatry

## 2022-12-27 ENCOUNTER — Other Ambulatory Visit: Payer: Self-pay | Admitting: Podiatry

## 2022-12-27 MED ORDER — DICLOFENAC SODIUM 1 % EX GEL
2.0000 g | Freq: Four times a day (QID) | CUTANEOUS | 2 refills | Status: AC
  Filled 2022-12-27: qty 100, 7d supply, fill #0

## 2022-12-27 NOTE — Telephone Encounter (Signed)
Pt called in and stated that his medication was not sent to the pharmacy.

## 2022-12-28 ENCOUNTER — Other Ambulatory Visit (HOSPITAL_BASED_OUTPATIENT_CLINIC_OR_DEPARTMENT_OTHER): Payer: Self-pay

## 2023-01-14 ENCOUNTER — Other Ambulatory Visit (HOSPITAL_BASED_OUTPATIENT_CLINIC_OR_DEPARTMENT_OTHER): Payer: Self-pay

## 2023-02-18 ENCOUNTER — Ambulatory Visit (HOSPITAL_BASED_OUTPATIENT_CLINIC_OR_DEPARTMENT_OTHER): Payer: Medicare Other | Admitting: Pulmonary Disease

## 2023-02-18 ENCOUNTER — Encounter (HOSPITAL_BASED_OUTPATIENT_CLINIC_OR_DEPARTMENT_OTHER): Payer: Self-pay | Admitting: Pulmonary Disease

## 2023-02-18 VITALS — BP 120/76 | HR 64 | Resp 16 | Ht 68.0 in | Wt 171.4 lb

## 2023-02-18 DIAGNOSIS — G4733 Obstructive sleep apnea (adult) (pediatric): Secondary | ICD-10-CM | POA: Diagnosis not present

## 2023-02-18 NOTE — Assessment & Plan Note (Signed)
CT abdomen was reviewed which shows average pressure of 9 and maximum pressure of 10 on auto settings 5 to 12 cm.  Average usage is 4.5 hours.  He has minimal leak.  Overall CPAP is certainly helped improve his daytime somnolence and fatigue. We discussed alternative of hypoglossal nerve stimulation device and he would like to continue with CPAP CPAP supplies will be renewed for a year compliance with goal of at least 4-6 hrs every night is the expectation. Advised against medications with sedative side effects Cautioned against driving when sleepy - understanding that sleepiness will vary on a day to day basis

## 2023-02-18 NOTE — Progress Notes (Signed)
   Subjective:    Patient ID: Alexander George, male    DOB: 11-07-1955, 67 y.o.   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 He continues to use his CPAP diligently.  However he wakes up at 5 AM for his morning prayer and then has difficulty going back to sleep.  Bedtime is around midnight or later. He does travel a little bit and inquires about a travel machine. he denies any problems with mask or pressure.  He could not tolerate full facemask and has settled down with nasal pillows  Significant tests/ events reviewed   06/2001 NPSG   RDI was 21 with a REM RDI of 57.  His oxygen nadir was 76%.   HST 08/2019 severe OSA, AHI 30/hour   coronary CT showed RA/RV dilatation. 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 Cardiac MRI on 03/03/2020 showed QP/QS 1.1, suggesting no significant shunt, mild RV dilatation with normal systolic function (EF 55%), normal LV size and systolic function (EF 56%), mild TR/PI/AI.    Zio patch x14 days on 04/25/2020 showed 13 episodes of SVT, longest lasting 20 beats.  Review of Systems neg for any significant sore throat, dysphagia, itching, sneezing, nasal congestion or excess/ purulent secretions, fever, chills, sweats, unintended wt loss, pleuritic or exertional cp, hempoptysis, orthopnea pnd or change in chronic leg swelling. Also denies presyncope, palpitations, heartburn, abdominal pain, nausea, vomiting, diarrhea or change in bowel or urinary habits, dysuria,hematuria, rash, arthralgias, visual complaints, headache, numbness weakness or ataxia.     Objective:   Physical Exam  Gen. Pleasant, well-nourished, in no distress ENT - no thrush, no pallor/icterus,no post nasal drip Neck: No JVD, no thyromegaly, no carotid bruits Lungs: no use of accessory muscles, no dullness to percussion, clear without rales or rhonchi  Cardiovascular: Rhythm  regular, heart sounds  normal, no murmurs or gallops, no peripheral edema Musculoskeletal: No deformities, no cyanosis or clubbing        Assessment & Plan:

## 2023-02-18 NOTE — Patient Instructions (Addendum)
CPAP is working well under current settings  You can see Dr Irene Pap  [phone]

## 2023-03-11 DIAGNOSIS — L821 Other seborrheic keratosis: Secondary | ICD-10-CM | POA: Diagnosis not present

## 2023-03-11 DIAGNOSIS — D492 Neoplasm of unspecified behavior of bone, soft tissue, and skin: Secondary | ICD-10-CM | POA: Diagnosis not present

## 2023-03-11 DIAGNOSIS — L280 Lichen simplex chronicus: Secondary | ICD-10-CM | POA: Diagnosis not present

## 2023-03-27 ENCOUNTER — Ambulatory Visit (INDEPENDENT_AMBULATORY_CARE_PROVIDER_SITE_OTHER): Payer: Medicare Other

## 2023-04-10 DIAGNOSIS — H903 Sensorineural hearing loss, bilateral: Secondary | ICD-10-CM | POA: Diagnosis not present

## 2023-04-22 ENCOUNTER — Telehealth (HOSPITAL_BASED_OUTPATIENT_CLINIC_OR_DEPARTMENT_OTHER): Payer: Self-pay | Admitting: Pulmonary Disease

## 2023-04-22 NOTE — Telephone Encounter (Signed)
Patient came into the [facility] office wanting a CPAP prescription to take with him. Patient states it is for insurance purposes. Informed patient we did not have a clinical staff member to address this in the office today and that Dr. Vassie Loll would be back on Wednesday.  Patient refused to go to [facility], and will be back on Wednesday. Please advise on new CPAP script to [facility]. Can we have this for patient to pick up in [place]?

## 2023-04-24 ENCOUNTER — Ambulatory Visit: Payer: Medicare Other | Admitting: Internal Medicine

## 2023-04-24 ENCOUNTER — Encounter: Payer: Self-pay | Admitting: Internal Medicine

## 2023-04-24 ENCOUNTER — Telehealth: Payer: Self-pay | Admitting: Family Medicine

## 2023-04-24 VITALS — BP 112/72 | HR 65 | Temp 98.0°F | Ht 68.0 in | Wt 172.4 lb

## 2023-04-24 DIAGNOSIS — H7393 Unspecified disorder of tympanic membrane, bilateral: Secondary | ICD-10-CM

## 2023-04-24 DIAGNOSIS — R0989 Other specified symptoms and signs involving the circulatory and respiratory systems: Secondary | ICD-10-CM | POA: Diagnosis not present

## 2023-04-24 DIAGNOSIS — J3089 Other allergic rhinitis: Secondary | ICD-10-CM

## 2023-04-24 DIAGNOSIS — J020 Streptococcal pharyngitis: Secondary | ICD-10-CM

## 2023-04-24 DIAGNOSIS — J069 Acute upper respiratory infection, unspecified: Secondary | ICD-10-CM

## 2023-04-24 DIAGNOSIS — J029 Acute pharyngitis, unspecified: Secondary | ICD-10-CM

## 2023-04-24 LAB — POCT RAPID STREP A (OFFICE): Rapid Strep A Screen: POSITIVE — AB

## 2023-04-24 LAB — POC COVID19 BINAXNOW: SARS Coronavirus 2 Ag: NEGATIVE

## 2023-04-24 MED ORDER — LORATADINE 10 MG PO TABS
10.0000 mg | ORAL_TABLET | Freq: Every day | ORAL | 11 refills | Status: AC
Start: 2023-04-24 — End: ?

## 2023-04-24 MED ORDER — FLUTICASONE PROPIONATE 50 MCG/ACT NA SUSP
2.0000 | Freq: Every day | NASAL | 6 refills | Status: AC
Start: 2023-04-24 — End: ?

## 2023-04-24 MED ORDER — PSEUDOEPHEDRINE HCL ER 120 MG PO TB12
120.0000 mg | ORAL_TABLET | Freq: Two times a day (BID) | ORAL | 0 refills | Status: AC
Start: 2023-04-24 — End: ?

## 2023-04-24 MED ORDER — SIMPLY SALINE 0.9 % NA AERS
2.0000 | INHALATION_SPRAY | NASAL | 11 refills | Status: AC
Start: 2023-04-24 — End: ?

## 2023-04-24 MED ORDER — AMOXICILLIN 500 MG PO CAPS
500.0000 mg | ORAL_CAPSULE | Freq: Two times a day (BID) | ORAL | 0 refills | Status: AC
Start: 2023-04-24 — End: 2023-05-04

## 2023-04-24 NOTE — Progress Notes (Signed)
[facility]: [phone]   -- Medical Office Visit --  Patient:  Alexander George      Age: 67 y.o.       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   SUBJECTIVE: 67 y.o. male who has OSA (obstructive sleep apnea); Degeneration of lumbar or lumbosacral intervertebral disc; Lumbar spondylosis; Benign prostatic hyperplasia with lower urinary tract symptoms; Dysphagia; Enlarged prostate; Gastroesophageal reflux disease; History of polycythemia; Hypogonadotropic hypogonadism (HCC); Asymptomatic varicose veins of both lower extremities; Tinnitus of both ears; and Screening for diabetes mellitus on their problem list.. Main reasons for visit/main concerns/chief complaint: Sore Throat (Symptoms for about two days. Had a low grade fever but not currently.) and Nasal Congestion (Runny nose.)    ------------------------------------------------------------------------------------------------------------------------ AI-Extracted: Discussed the use of AI scribe software for clinical note transcription with the patient, who gave verbal consent to proceed.  History of Present Illness   The patient presents with a two-day history of nasal blockage, sneezing, and a sore throat. He reports that the nasal blockage alternates between the left and right nostril, a condition he has experienced for several years. However, the recent onset of a sore throat and increased nasal drainage prompted his visit. The patient also experienced a mild fever the previous night, but no other systemic symptoms were reported.  The patient has a history of hearing loss, which was recently evaluated by an audiologist. He was unaware of any damage to his ears prior to this consultation. The patient denies any ear pain or recent trauma to the ears.  The patient's symptoms suggest a possible viral infection, potentially exacerbated by allergies. However, the persistence of the current symptoms is unusual for the patient, leading to concerns about a possible COVID-19 infection.       Note that patient  has a past medical history of Arthritis, Cataract, GERD (gastroesophageal reflux disease), Heart murmur, Neuromuscular disorder (HCC), Seizures (HCC) (40 years ago ), and Sleep apnea.  Problem list overviews that were updated at today's visit:No problems updated.  Med reconciliation: Current Outpatient Medications on File Prior to Visit  Medication Sig   diclofenac Sodium (VOLTAREN) 1 % GEL Apply 2 g topically 4 (four) times daily. Rub into affected area of foot  2 to 4 times daily (Patient not taking: Reported on 04/24/2023)   No current facility-administered medications on file prior to visit.  There are no discontinued medications.   Objective   Physical Exam     04/24/2023    1:02 PM 02/18/2023    3:13 PM  12/05/2022    1:29 PM  Vitals with BMI  Height 5\' 8"  5\' 8"  5\' 8"   Weight 172 lbs 6 oz 171 lbs 6 oz 167 lbs 2 oz  BMI 26.22 26.07 25.42  Systolic 112 120 440  Diastolic 72 76 70  Pulse 65 64 56   Wt Readings from Last 10 Encounters:  04/24/23 172 lb 6.4 oz (78.2 kg)  02/18/23 171 lb 6.4 oz (77.7 kg)  12/05/22 167 lb 2 oz (75.8 kg)  10/29/22 168 lb 6 oz (76.4 kg)  07/18/22 172 lb 2 oz (78.1 kg)  01/09/22 164 lb 9.6 oz (74.7 kg)  11/23/21 165 lb 2 oz (74.9 kg)  04/03/21 162 lb 6.4 oz (73.7 kg)  01/31/21 161 lb (73 kg)  01/26/21 161 lb (73 kg)   Vital signs reviewed.  Nursing notes reviewed. Weight trend reviewed. Abnormalities and Problem-Specific physical exam findings:  Physical Exam   HEENT: Chronic swelling in the right nasal turbinate. Bilateral eardrum scarring, more pronounced on the right. NECK: No lymphadenopathy or tenderness.     General Appearance:  No acute distress appreciable.   Well-groomed, healthy-appearing male.  Well proportioned with no abnormal fat distribution.  Good muscle tone. Pulmonary:  Normal work of breathing at rest, no respiratory distress apparent. SpO2: 97 %  Musculoskeletal: All extremities are intact.  Neurological:  Awake, alert, oriented, and engaged.  No obvious focal neurological deficits or cognitive impairments.  Sensorium seems unclouded.   Speech is clear and coherent with logical content. Psychiatric:  Appropriate mood, pleasant and cooperative demeanor, thoughtful and engaged during the exam  Results   LABS Strep test: Positive (04/24/2023)        Results for orders placed or performed in visit on 04/24/23  POC COVID-19  Result Value Ref Range   SARS Coronavirus 2 Ag Negative Negative  POCT rapid strep A  Result Value Ref Range   Rapid Strep A Screen Positive (A) Negative    Office Visit on 04/24/2023  Component Date Value   SARS Coronavirus 2 Ag 04/24/2023 Negative    Rapid Strep A Screen 04/24/2023 Positive (A)   Office  Visit on 07/18/2022  Component Date Value   Color, UA 07/18/2022 YELLOW    Clarity, UA 07/18/2022 CLEAR    Glucose, UA 07/18/2022 Negative    Bilirubin, UA 07/18/2022 NEG    Ketones, UA 07/18/2022 NEG    Spec Grav, UA 07/18/2022 <=1.005 (A)    Blood, UA 07/18/2022 NEG    pH, UA 07/18/2022 6.0    Protein, UA 07/18/2022 Negative    Urobilinogen, UA 07/18/2022 0.2    Nitrite, UA 07/18/2022 NEG    Leukocytes, UA 07/18/2022 Negative    MICRO NUMBER: 07/18/2022 [id number]    SPECIMEN QUALITY: 07/18/2022 Adequate    Sample Source 07/18/2022 URINE    STATUS: 07/18/2022 FINAL    Result: 07/18/2022 No Growth    PSA 07/18/2022 1.02    Sodium 07/18/2022 140    Potassium 07/18/2022 4.0    Chloride 07/18/2022 104    CO2 07/18/2022 28    Glucose, Bld 07/18/2022 97    BUN 07/18/2022 17    Creatinine, Ser 07/18/2022 0.83    Total  Bilirubin 07/18/2022 0.8    Alkaline Phosphatase 07/18/2022 38 (L)    AST 07/18/2022 17    ALT 07/18/2022 13    Total Protein 07/18/2022 7.4    Albumin 07/18/2022 4.7    GFR 07/18/2022 91.46    Calcium 07/18/2022 9.1    WBC 07/18/2022 4.5    RBC 07/18/2022 4.77    Hemoglobin 07/18/2022 14.2    HCT 07/18/2022 42.1    MCV 07/18/2022 88.2    MCHC 07/18/2022 33.7    RDW 07/18/2022 14.9    Platelets 07/18/2022 185.0    Neutrophils Relative % 07/18/2022 47.3    Lymphocytes Relative 07/18/2022 39.2    Monocytes Relative 07/18/2022 7.8    Eosinophils Relative 07/18/2022 4.7    Basophils Relative 07/18/2022 1.0    Neutro Abs 07/18/2022 2.1    Lymphs Abs 07/18/2022 1.8    Monocytes Absolute 07/18/2022 0.4    Eosinophils Absolute 07/18/2022 0.2    Basophils Absolute 07/18/2022 0.0    No image results found.   DG Foot Complete Right  Result Date: 01/25/2023 Please see detailed radiograph report in office note.   DG Toe Great Right  Result Date: 10/31/2022 CLINICAL DATA:  Pain. EXAM: RIGHT GREAT TOE COMPARISON:  None Available. FINDINGS: There is no evidence of  fracture or dislocation. There is no evidence of arthropathy or other focal bone abnormality. Soft tissues are unremarkable. IMPRESSION: Negative. Electronically Signed   By: Layla Maw M.D.   On: 10/31/2022 19:25      Additional Info: This encounter employed real-time, collaborative documentation. The patient actively reviewed and updated their medical record on a shared screen, ensuring transparency and facilitating joint problem-solving for the problem list, overview, and plan. This approach promotes accurate, informed care. The treatment plan was discussed and reviewed in detail, including medication safety, potential side effects, and all patient questions. We confirmed understanding and comfort with the plan. Follow-up instructions were established, including contacting the office for any concerns, returning if symptoms worsen, persist, or new symptoms develop, and precautions for potential emergency department visits.

## 2023-04-24 NOTE — Telephone Encounter (Signed)
Patient given CPAP rx today.

## 2023-04-24 NOTE — Telephone Encounter (Signed)
[facility] called and states they do not carry  Saline (SIMPLY SALINE) 0.9 % AERS  And would like for you to send the RX to another pharmacy or change it. Please advise.

## 2023-04-24 NOTE — Patient Instructions (Addendum)
VISIT SUMMARY:  You came in today with a two-day history of nasal blockage, sneezing, and a sore throat. You also reported a mild fever last night. We discussed your symptoms and possible causes, including a viral infection and allergies. Additionally, we reviewed your recent hearing loss evaluation.  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  -TYMPANOSCLEROSIS: This is a condition where there is scarring on your eardrums, likely from past infections or chronic allergies, leading to hearing loss. We have documented this in your medical record and discussed the potential for a specialist referral if you wish.  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 This handout will provide you with information about your condition and the treatment plan we have discussed.  What is Tympanosclerosis? Tympanosclerosis is a buildup of hardened, calcified tissue on the eardrum (tympanic membrane) and/or the small bones in the middle ear (ossicles). This can occur after repeated middle ear infections or other trauma to the ear.  In your case, the tympanosclerosis is more pronounced in your right ear compared to your left. This asymmetry can contribute to your current hearing loss. Symptoms of Tympanosclerosis:  Gradual hearing loss, often worse in one ear Feeling of fullness or pressure in the affected ear(s) Possible ringing or buzzing sounds (tinnitus)  Treatment Plan:  Manage your upper respiratory infection (URI):  Use the prescribed medications (Flonase, saline nasal mist, Claritin, Sudafed) as directed to reduce nasal/sinus congestion and inflammation. This will help improve Eustachian tube function and middle ear ventilation.   Monitor your hearing:  We will continue to monitor your hearing through regular audiometric testing. If the hearing loss progresses, we may  recommend trying hearing aids or other assistive listening devices.   Manage tympanosclerosis:  In some cases, surgical intervention may be an option to improve hearing, but this is typically reserved for more severe cases. We will continue to observe the tympanosclerosis and discuss any potential treatment options if the hearing loss significantly worsens.    It's important to keep me informed of any changes in your symptoms or concerns. Together, we can work to manage your tympanosclerosis and hearing loss effectively.   It was a pleasure seeing you today! Your health and satisfaction are our top priorities.  Glenetta Hew, MD  Your Providers PCP: Jeani Sow, MD,  [phone]) Referring Provider: Jeani Sow, MD,  [phone]) Care Team Provider: Little Ishikawa, MD,  [phone])     NEXT STEPS: [x]  Early Intervention: Schedule sooner appointment, call our on-call services, or go to emergency room if there is any significant Increase in pain or discomfort New or worsening symptoms Sudden or severe changes in your health [x]  Flexible Follow-Up: We recommend a No follow-ups on file. for optimal routine care. This allows for progress monitoring and treatment adjustments. [x]  Preventive Care: Schedule your annual preventive care visit! It's typically covered by insurance and helps identify potential health issues early. [x]  Lab & X-ray Appointments: Incomplete tests scheduled today, or call to schedule. X-rays: [facility] (M-F, 8:30am-noon or 1pm-5pm). [x]  Medical Information Release: Sign a release form at front desk to obtain relevant medical information we don't have.  MAKING THE MOST OF OUR FOCUSED 20 MINUTE APPOINTMENTS: [x]   Clearly state your top concerns at the beginning of the visit to focus our discussion [x]   If you anticipate you will need more time, please inform the front desk during scheduling -  we can book multiple  appointments in the same week. [x]   If you have transportation problems- use our convenient video appointments or ask about transportation support. [x]   We can get down to business faster if you use MyChart to update information before the visit and submit non-urgent questions before your visit. Thank you for taking the time to provide details through MyChart.  Let our nurse know and she can import this information into your encounter documents.  Arrival and Wait Times: [x]   Arriving on time ensures that everyone receives prompt attention. [x]   Early morning (8a) and afternoon (1p) appointments tend to have shortest wait times. [x]   Unfortunately, we cannot delay appointments for late arrivals or hold slots during phone calls.  Getting Answers and Following Up [x]   Simple Questions & Concerns: For quick questions or basic follow-up after your visit, reach [place] at [phone] or MyChart messaging. [x]   Complex Concerns: If your concern is more complex, scheduling an appointment might be best. Discuss this with the staff to find the most suitable option. [x]   Lab & Imaging Results: We'll contact you directly if results are abnormal or you don't use MyChart. Most normal results will be on MyChart within 2-3 business days, with a review message from Dr. Jon Billings. Haven't heard back in 2 weeks? Need results sooner? Contact us at [phone]. [x]   Referrals: Our referral coordinator will manage specialist referrals. The specialist's office should contact you within 2 weeks to schedule an appointment. Call us if you haven't heard from them after 2 weeks.  Staying Connected [x]   MyChart: Activate your MyChart for the fastest way to access results and message [place]. See the last page of this paperwork for instructions on how to activate.  Bring to Your Next Appointment [x]   Medications: Please bring all your medication bottles to your next appointment to ensure we have an accurate record of your  prescriptions. [x]   Health Diaries: If you're monitoring any health conditions at home, keeping a diary of your readings can be very helpful for discussions at your next appointment.  Billing [x]   X-ray & Lab Orders: These are billed by separate companies. Contact the invoicing company directly for questions or concerns. [x]   Visit Charges: Discuss any billing inquiries with our administrative services team.  Your Satisfaction Matters [x]   Share Your Experience: We strive for your satisfaction! If you have any complaints, or preferably compliments, please let Dr. Jon Billings know directly or contact our Practice Administrators, Edwena Felty or [employer], by asking at the front desk.   Reviewing Your Records [x]   Review this early draft of your clinical encounter notes below and the final encounter summary tomorrow on MyChart after its been completed.  All orders placed so far are visible here: Strep pharyngitis -     Amoxicillin; Take 1 capsule (500 mg total) by mouth 2 (two) times daily for 10 days.  Dispense: 20 capsule; Refill: 0  Viral upper respiratory tract infection -     Fluticasone Propionate; Place 2 sprays into both nostrils daily.  Dispense: 16 g; Refill: 6 -     Simply Saline; Place 2 each into the nose as directed. Use nightly for sinus hygiene long-term.  Can also be used as many times daily as desired to assist with clearing congested sinuses.  Dispense: 127 mL; Refill: 11 -     Loratadine; Take 1 tablet (10 mg total) by mouth daily.  Dispense: 30 tablet; Refill: 11 -  Pseudoephedrine HCl ER; Take 1 tablet (120 mg total) by mouth 2 (two) times daily.  Dispense: 20 tablet; Refill: 0 -     Amoxicillin; Take 1 capsule (500 mg total) by mouth 2 (two) times daily for 10 days.  Dispense: 20 capsule; Refill: 0  Allergic rhinitis due to other allergic trigger, unspecified seasonality -     Fluticasone Propionate; Place 2 sprays into both nostrils daily.  Dispense: 16 g; Refill:  6 -     Simply Saline; Place 2 each into the nose as directed. Use nightly for sinus hygiene long-term.  Can also be used as many times daily as desired to assist with clearing congested sinuses.  Dispense: 127 mL; Refill: 11 -     Loratadine; Take 1 tablet (10 mg total) by mouth daily.  Dispense: 30 tablet; Refill: 11 -     Pseudoephedrine HCl ER; Take 1 tablet (120 mg total) by mouth 2 (two) times daily.  Dispense: 20 tablet; Refill: 0 -     Amoxicillin; Take 1 capsule (500 mg total) by mouth 2 (two) times daily for 10 days.  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  Dispense: 16 g; Refill: 6 -     Simply Saline; Place 2 each into the nose as directed. Use nightly for sinus hygiene long-term.  Can also be used as many times daily as desired to assist with clearing congested sinuses.  Dispense: 127 mL; Refill: 11 -     Loratadine; Take 1 tablet (10 mg total) by mouth daily.  Dispense: 30 tablet; Refill: 11 -     Pseudoephedrine HCl ER; Take 1 tablet (120 mg total) by mouth 2 (two) times daily.  Dispense: 20 tablet; Refill: 0 -     Amoxicillin; Take 1 capsule (500 mg total) by mouth 2 (two) times daily for 10 days.  Dispense: 20 capsule; Refill: 0

## 2023-04-24 NOTE — Telephone Encounter (Signed)
Please see message below and advise.

## 2023-04-25 ENCOUNTER — Other Ambulatory Visit: Payer: Self-pay | Admitting: *Deleted

## 2023-04-25 DIAGNOSIS — G4733 Obstructive sleep apnea (adult) (pediatric): Secondary | ICD-10-CM

## 2023-04-25 NOTE — Telephone Encounter (Signed)
Patient came back to office. States [facility] told him that we did not give him a prescription. They are needing face to face notes, demographics and pressure setting. Explained that [facility] has access to our charts and has what they need, but he is claiminng they dont. He is very upset. Explained that I would put urgent message in and have a nurse call as we do not have anyone at [facility] today. He became angrier and is on his way to [facility]. Explained that all nurses are with providers there and that he would likely have to wait. Please advise.

## 2023-04-25 NOTE — Telephone Encounter (Signed)
Order has been sent, pt spoke with [facility] today after coming into the office. Nfn

## 2023-04-26 DIAGNOSIS — M25562 Pain in left knee: Secondary | ICD-10-CM | POA: Diagnosis not present

## 2023-04-26 DIAGNOSIS — M25561 Pain in right knee: Secondary | ICD-10-CM | POA: Diagnosis not present

## 2023-04-26 NOTE — Telephone Encounter (Signed)
Left message to return call to office.

## 2023-04-26 NOTE — Telephone Encounter (Signed)
Left detailed message informing of patient message below.

## 2023-06-06 ENCOUNTER — Telehealth: Payer: Self-pay | Admitting: Pulmonary Disease

## 2023-06-06 NOTE — Telephone Encounter (Signed)
Patient came into the office requesting to speak with [facility]. Patient wanted to know the status of this CPAP supplies, he has been waiting for supplies for over a month and a half. I contacted [facility] and spoke with Brad who states order was sent to [facility]. I contacted [facility] and was told they did not have information on this patient. I sent an email to [facility] to advise.

## 2023-06-07 NOTE — Telephone Encounter (Signed)
Spoke with [facility] and they received the order late yesterday, but it is generic and they will need to know the type of machine, type of mask the patient uses, etc. Emailed Mitch again to assist with getting this information over to [facility]. Called patient to inform of this information.

## 2023-06-10 NOTE — Telephone Encounter (Signed)
Spoke with Tiffany at [facility] who states they now have everything they need to process the resupply order and they will reach out to the patient. Left voicemail on patients phone with this information.

## 2023-06-14 NOTE — Telephone Encounter (Signed)
 Spoke with Renee at [facility] that states they tried to reach out to patient, the number on file was incorrect. Corrected and was told patient would be contacted on correct number today.

## 2023-06-14 NOTE — Telephone Encounter (Signed)
 Left voice mail informing patient of this information

## 2023-08-07 DIAGNOSIS — M180 Bilateral primary osteoarthritis of first carpometacarpal joints: Secondary | ICD-10-CM | POA: Diagnosis not present

## 2023-11-29 ENCOUNTER — Ambulatory Visit (INDEPENDENT_AMBULATORY_CARE_PROVIDER_SITE_OTHER): Admitting: Internal Medicine

## 2023-11-29 ENCOUNTER — Encounter: Payer: Self-pay | Admitting: Internal Medicine

## 2023-11-29 VITALS — BP 100/62 | HR 75 | Temp 98.0°F | Ht 68.0 in | Wt 168.4 lb

## 2023-11-29 DIAGNOSIS — M795 Residual foreign body in soft tissue: Secondary | ICD-10-CM | POA: Diagnosis not present

## 2023-11-29 DIAGNOSIS — K130 Diseases of lips: Secondary | ICD-10-CM | POA: Diagnosis not present

## 2023-11-29 DIAGNOSIS — L859 Epidermal thickening, unspecified: Secondary | ICD-10-CM | POA: Diagnosis not present

## 2023-11-29 DIAGNOSIS — N401 Enlarged prostate with lower urinary tract symptoms: Secondary | ICD-10-CM | POA: Insufficient documentation

## 2023-11-29 DIAGNOSIS — R229 Localized swelling, mass and lump, unspecified: Secondary | ICD-10-CM

## 2023-11-29 DIAGNOSIS — L03818 Cellulitis of other sites: Secondary | ICD-10-CM | POA: Diagnosis not present

## 2023-11-29 MED ORDER — UREA 40 % EX CREA
1.0000 | TOPICAL_CREAM | Freq: Every evening | CUTANEOUS | 0 refills | Status: DC
Start: 2023-11-29 — End: 2023-11-29

## 2023-11-29 MED ORDER — CLOTRIMAZOLE 1 % EX OINT
1.0000 | TOPICAL_OINTMENT | CUTANEOUS | 11 refills | Status: DC
Start: 2023-12-02 — End: 2023-11-29

## 2023-11-29 MED ORDER — DOXYCYCLINE HYCLATE 100 MG PO TABS
100.0000 mg | ORAL_TABLET | Freq: Two times a day (BID) | ORAL | 0 refills | Status: AC
Start: 2023-11-29 — End: ?

## 2023-11-29 MED ORDER — UREA 40 % EX CREA
1.0000 | TOPICAL_CREAM | Freq: Every evening | CUTANEOUS | 0 refills | Status: AC
Start: 2023-11-29 — End: ?

## 2023-11-29 MED ORDER — CLOTRIMAZOLE 1 % EX OINT
1.0000 | TOPICAL_OINTMENT | CUTANEOUS | 11 refills | Status: AC
Start: 2023-12-02 — End: ?

## 2023-11-29 MED ORDER — HYDROCORTISONE 1 % EX OINT
1.0000 | TOPICAL_OINTMENT | Freq: Two times a day (BID) | CUTANEOUS | 0 refills | Status: AC
Start: 2023-11-29 — End: ?

## 2023-11-29 MED ORDER — HYDROCORTISONE 1 % EX OINT: 1.0000 | TOPICAL_OINTMENT | Freq: Two times a day (BID) | CUTANEOUS | 0 refills | Status: DC

## 2023-11-29 MED ORDER — DOXYCYCLINE HYCLATE 100 MG PO TABS
100.0000 mg | ORAL_TABLET | Freq: Two times a day (BID) | ORAL | 0 refills | Status: DC
Start: 2023-11-29 — End: 2023-11-29

## 2023-11-29 MED ORDER — ZINC OXIDE 20 % EX OINT
TOPICAL_OINTMENT | Freq: Two times a day (BID) | CUTANEOUS | 11 refills | Status: AC
Start: 2023-11-29 — End: ?

## 2023-11-29 MED ORDER — ZINC OXIDE 20 % EX OINT
TOPICAL_OINTMENT | Freq: Two times a day (BID) | CUTANEOUS | 11 refills | Status: DC
Start: 2023-11-29 — End: 2023-11-29

## 2023-11-29 NOTE — Progress Notes (Unsigned)
 Photographs Taken 11/29/2023 : ==============================  [facility]: [phone]   -- Medical Office Visit --  Patient: Alexander George      Age: 68 y.o.       Sex:  male  Date:   11/29/2023 Today's Healthcare Provider: Anthon Kins, MD  ==============================   Chief Complaint: dry lips (Pt has dry lips getting really bad he states at times chapstick is not helping.) and swelling finger (Left index finger is little swallowing was picking up gloves from the yard work and something bite him or he doesn't know what happen but the stop is not getting any better. States might be something in the finger not for sure sore as well.)   Discussed the use of AI scribe software for clinical note transcription with the patient, who gave verbal consent to proceed.  History of Present Illness Alexander George is a 68 year old male who presents with chest pain and swelling of the left middle finger.  He has been experiencing chest pain, but the nature, duration, or specific characteristics of the pain were not elaborated upon. No recent fever or systemic symptoms were noted.  He describes swelling and pain in his left middle finger, which began approximately six weeks ago. The onset occurred after reaching for gloves in his garage, with a suspicion of a spider bite or foreign object like a splinter. The pain varies, sometimes requiring pressure to elicit pain, while at other times, a light touch is sufficient. He attempted to self-treat by trying to remove a suspected foreign object, which provided temporary relief but did not resolve the issue.  He reports dryness and itchiness of the lips, which started around six to seven weeks ago. He describes the presence of a small dot and recurrent dead skin that he has to cut away. The dryness and itchiness affect the entire lip area, with specific spots being more pronounced. He has tried using lip  balm to alleviate the dryness, but it has not been effective. No history of lip licking to keep them moist.    {{No specialty comments available.:1}{ Problem List as of 11/29/2023 Reviewed: 04/24/2023  3:40 PM by Anthon Kins, MD    Tinnitus of both ears   Screening for diabetes mellitus   OSA (obstructive sleep apnea)   Last Assessment & Plan 02/18/2023 Office Visit Written 02/18/2023  9:35 PM by Lind Repine, MD  CT abdomen was reviewed which shows average pressure of 9 and maximum pressure of 10 on auto settings 5 to 12 cm.  Average usage is 4.5 hours.  He has minimal leak.  Overall CPAP is certainly helped improve his daytime somnolence and fatigue. We discussed alternative of hypoglossal nerve stimulation device and he would like to continue with CPAP CPAP supplies will be renewed for a year compliance with goal of at least 4-6 hrs every night is the expectation. Advised against medications with sedative side effects Cautioned against driving when sleepy - understanding that sleepiness will vary on a day to day basis       Lumbar spondylosis   Lower urinary tract symptoms due to benign prostatic hyperplasia   Hypogonadotropic hypogonadism (HCC)   History of polycythemia   Gastroesophageal reflux disease   Enlarged prostate   Dysphagia   Degeneration of lumbar or lumbosacral intervertebral disc   Bilateral sensorineural hearing loss   Benign prostatic hyperplasia with lower urinary tract symptoms   Asymptomatic varicose veins of both lower  extremities  :1}}  Updated Problem List Entries: No problems updated.  Background Reviewed: Problem List: has OSA (obstructive sleep apnea); Degeneration of lumbar or lumbosacral intervertebral disc; Lumbar spondylosis; Benign prostatic hyperplasia with lower urinary tract symptoms; Dysphagia; Enlarged prostate; Gastroesophageal reflux disease; History of polycythemia; Hypogonadotropic hypogonadism (HCC); Asymptomatic varicose veins of  both lower extremities; Tinnitus of both ears; Screening for diabetes mellitus; Bilateral sensorineural hearing loss; and Lower urinary tract symptoms due to benign prostatic hyperplasia on their problem list. Past Medical History:  has a past medical history of Arthritis, Cataract, GERD (gastroesophageal reflux disease), Heart murmur, Neuromuscular disorder (HCC), Seizures (HCC) (40 years ago ), and Sleep apnea. Past Surgical History:   has a past surgical history that includes Hernia repair (Left); Knee surgery (Left); Colonoscopy (07/20/2005); and Polypectomy. Social History:   reports that he has never smoked. He has been exposed to tobacco smoke. He has never used smokeless tobacco. He reports that he does not drink alcohol and does not use drugs. Family History:  family history includes Cancer in his sister; Diabetes in his father; Stomach cancer in his sister. Allergies:  is allergic to androgens.   Medication Reconciliation: Current Outpatient Medications on File Prior to Visit  Medication Sig  . diclofenac  Sodium (VOLTAREN ) 1 % GEL Apply 2 g topically 4 (four) times daily. Rub into affected area of foot 2 to 4 times daily (Patient not taking: Reported on 04/24/2023)  . fluticasone  (FLONASE ) 50 MCG/ACT nasal spray Place 2 sprays into both nostrils daily. (Patient not taking: Reported on 11/29/2023)  . loratadine  (CLARITIN ) 10 MG tablet Take 1 tablet (10 mg total) by mouth daily. (Patient not taking: Reported on 11/29/2023)  . pseudoephedrine  (SUDAFED 12 HOUR) 120 MG 12 hr tablet Take 1 tablet (120 mg total) by mouth 2 (two) times daily. (Patient not taking: Reported on 11/29/2023)  . Saline (SIMPLY SALINE) 0.9 % AERS Place 2 each into the nose as directed. Use nightly for sinus hygiene long-term.  Can also be used as many times daily as desired to assist with clearing congested sinuses. (Patient not taking: Reported on 11/29/2023)   No current facility-administered medications on file prior to  visit.  There are no discontinued medications.   Physical Exam:    11/29/2023    4:06 PM 04/24/2023    1:02 PM 02/18/2023    3:13 PM  Vitals with BMI  Height 5' 8 5' 8 5' 8  Weight 168 lbs 6 oz 172 lbs 6 oz 171 lbs 6 oz  BMI 25.61 26.22 26.07  Systolic 100 112 284  Diastolic 62 72 76  Pulse 75 65 64  Vital signs reviewed.  Nursing notes reviewed. Weight trend reviewed. Physical Exam General Appearance:  No acute distress appreciable.   Well-groomed, healthy-appearing male.  Well proportioned with no abnormal fat distribution.  Good muscle tone. Pulmonary:  Normal work of breathing at rest, no respiratory distress apparent. SpO2: 98 %  Musculoskeletal: All extremities are intact.  Neurological:  Awake, alert, oriented, and engaged.  No obvious focal neurological deficits or cognitive impairments.  Sensorium seems unclouded.   Speech is clear and coherent with logical content. Psychiatric:  Appropriate mood, pleasant and cooperative demeanor, thoughtful and engaged during the exam Physical Exam SKIN: Lesion with pale raised swelling on left middle finger, resembling a spider bite. Core present, possible dermatofibroma on left middle finger. No pus or fluctuation in left middle finger. Tongue normal. Lips dry with small dot on side. ***  Results:  12/05/2022    1:39 PM 07/18/2022   11:04 AM 04/03/2021    8:07 AM 12/28/2020    2:45 PM  PHQ 2/9 Scores  PHQ - 2 Score 0 0 0 0  PHQ- 9 Score 1 0 1    Results   {Insert previous labs (optional):23779} {See past labs  Heme  Chem  Endocrine  Serology  Results Review (optional):1} No results found for any visits on 11/29/23. Office Visit on 04/24/2023  Component Date Value Ref Range Status  . SARS Coronavirus 2 Ag 04/24/2023 Negative  Negative Final  . Rapid Strep A Screen 04/24/2023 Positive (A)  Negative Final  Office Visit on 07/18/2022  Component Date Value Ref Range Status  . Color, UA 07/18/2022 YELLOW   Final  .  Clarity, UA 07/18/2022 CLEAR   Final  . Glucose, UA 07/18/2022 Negative  Negative Final  . Bilirubin, UA 07/18/2022 NEG   Final  . Ketones, UA 07/18/2022 NEG   Final  . Spec Grav, UA 07/18/2022 <=1.005 (A)  1.010 - 1.025 Final  . Blood, UA 07/18/2022 NEG   Final  . pH, UA 07/18/2022 6.0  5.0 - 8.0 Final  . Protein, UA 07/18/2022 Negative  Negative Final  . Urobilinogen, UA 07/18/2022 0.2  0.2 or 1.0 E.U./dL Final  . Nitrite, UA 30/86/5784 NEG   Final  . Leukocytes, UA 07/18/2022 Negative  Negative Final  . MICRO NUMBER: 07/18/2022 [id number]   Final  . SPECIMEN QUALITY: 07/18/2022 Adequate   Final  . Sample Source 07/18/2022 URINE   Final  . STATUS: 07/18/2022 FINAL   Final  . Result: 07/18/2022 No Growth   Final  . PSA 07/18/2022 1.02  0.10 - 4.00 ng/mL Final  . Sodium 07/18/2022 140  135 - 145 mEq/L Final  . Potassium 07/18/2022 4.0  3.5 - 5.1 mEq/L Final  . Chloride 07/18/2022 104  96 - 112 mEq/L Final  . CO2 07/18/2022 28  19 - 32 mEq/L Final  . Glucose, Bld 07/18/2022 97  70 - 99 mg/dL Final  . BUN 41/32/4401 17  6 - 23 mg/dL Final  . Creatinine, Ser 07/18/2022 0.83  0.40 - 1.50 mg/dL Final  . Total Bilirubin 07/18/2022 0.8  0.2 - 1.2 mg/dL Final  . Alkaline Phosphatase 07/18/2022 38 (L)  39 - 117 U/L Final  . AST 07/18/2022 17  0 - 37 U/L Final  . ALT 07/18/2022 13  0 - 53 U/L Final  . Total Protein 07/18/2022 7.4  6.0 - 8.3 g/dL Final  . Albumin 02/72/5366 4.7  3.5 - 5.2 g/dL Final  . GFR 44/08/4740 91.46  >60.00 mL/min Final  . Calcium 07/18/2022 9.1  8.4 - 10.5 mg/dL Final  . WBC 59/56/3875 4.5  4.0 - 10.5 K/uL Final  . RBC 07/18/2022 4.77  4.22 - 5.81 Mil/uL Final  . Hemoglobin 07/18/2022 14.2  13.0 - 17.0 g/dL Final  . HCT 64/33/2951 42.1  39.0 - 52.0 % Final  . MCV 07/18/2022 88.2  78.0 - 100.0 fl Final  . MCHC 07/18/2022 33.7  30.0 - 36.0 g/dL Final  . RDW 88/41/6606 14.9  11.5 - 15.5 % Final  . Platelets 07/18/2022 185.0  150.0 - 400.0 K/uL Final  . Neutrophils  Relative % 07/18/2022 47.3  43.0 - 77.0 % Final  . Lymphocytes Relative 07/18/2022 39.2  12.0 - 46.0 % Final  . Monocytes Relative 07/18/2022 7.8  3.0 - 12.0 % Final  . Eosinophils Relative 07/18/2022 4.7  0.0 - 5.0 %  Final  . Basophils Relative 07/18/2022 1.0  0.0 - 3.0 % Final  . Neutro Abs 07/18/2022 2.1  1.4 - 7.7 K/uL Final  . Lymphs Abs 07/18/2022 1.8  0.7 - 4.0 K/uL Final  . Monocytes Absolute 07/18/2022 0.4  0.1 - 1.0 K/uL Final  . Eosinophils Absolute 07/18/2022 0.2  0.0 - 0.7 K/uL Final  . Basophils Absolute 07/18/2022 0.0  0.0 - 0.1 K/uL Final  No image results found. No results found.       Assessment & Plan Hyperkeratosis  Cellulitis of other specified site  Foreign body (FB) in soft tissue  Cheilitis   Assessment and Plan Assessment & Plan Neurotoxic insect bite with secondary infection   Swelling and pain in his left middle finger have persisted for six weeks following yard work, likely due to a neurotoxic insect bite with a core suggesting a dermatofibroma. No visible foreign body is present, but a secondary bacterial infection is possible. Surgical intervention is avoided due to potential complications, with the expectation of natural expulsion of the foreign body. Prescribe doxycycline for the potential bacterial infection. Instruct on warm soaks three to four times daily in warm soapy water. Apply 40% urea cream overnight to soften the skin and facilitate foreign body expulsion. Use topical antibiotics to prevent infection. Provide a sterile needle for potential self-drainage after one week of treatment if necessary, advising on proper technique including the use of alcohol swabs and bandages.  Angular cheilitis   He experiences dryness and itching of the lips with white spots for six to seven weeks, suspected to be angular cheilitis due to a possible fungal infection or allergic reaction. He denies chronic lip licking and has no systemic fungal infection. Preference  is to avoid steroids initially. Discussed potential contagion if fungal. Prescribe antifungal lip balm as the first line of treatment. Prescribe hydrocortisone steroid ointment as a secondary option if the antifungal treatment is ineffective. Advise on the use of petroleum jelly and avoidance of lip licking. Discuss the potential for contagion if the condition is fungal in nature.  Recording duration: 42 minutes          Orders Placed in Encounter:   Lab Orders  No laboratory test(s) ordered today   Imaging Orders  No imaging studies ordered today   Referral Orders  No referral(s) requested today   No orders of the defined types were placed in this encounter.    No orders of the defined types were placed in this encounter.  ED Discharge Orders     None         This document was synthesized by artificial intelligence ([employer]) using HIPAA-compliant recording of the clinical interaction;   We discussed the use of AI scribe software for clinical note transcription with the patient, who gave verbal consent to proceed. additional Info: This encounter employed state-of-the-art, real-time, collaborative documentation. The patient actively reviewed and assisted in updating their electronic medical record on a shared screen, ensuring transparency and facilitating joint problem-solving for the problem list, overview, and plan. This approach promotes accurate, informed care. The treatment plan was discussed and reviewed in detail, including medication safety, potential side effects, and all patient questions. We confirmed understanding and comfort with the plan. Follow-up instructions were established, including contacting the office for any concerns, returning if symptoms worsen, persist, or new symptoms develop, and precautions for potential emergency department visits.

## 2023-11-29 NOTE — Patient Instructions (Signed)
 Patient Instructions: Foreign Body Removal - Middle Finger Your Condition You have a suspected foreign body (likely splinter or plant material) in your middle finger from 6 weeks ago. We can see a hard core with magnification, and our goal is to soften the tissue so it can come out naturally or with gentle expression. Treatment Plan: 2-3 Week Process Week 1-2: Softening Phase Daily routine: Morning and Evening: Apply 40% urea cream thickly over the area Cover with small bandage or adhesive bandage Leave on for several hours or overnight Warm Soaks (3-4 times daily): Soak finger in warm soapy water for 15-20 minutes Use comfortably warm water (not scalding) Dry gently and apply topical antibiotic ointment Medication: Take doxycycline as prescribed for 7 days Complete the full course even if improving Week 2-3: Expression Phase (if core becomes more visible) When to try expression: Core looks more prominent or points to surface Surrounding skin feels softer After warm soak when tissue is most pliable Safe expression technique: Prepare area: Clean hands thoroughly Clean finger with alcohol swab Have clean bandage ready Create small opening: Use 25-gauge sterile needle to make tiny puncture directly over the core Insert needle at shallow angle, just breaking the surface Gentle expression: Use cotton swabs (NOT fingernails) to apply gentle pressure from sides Work slowly - multiple gentle attempts over days is better than forcing Stop if significant pain or bleeding occurs After expression: Clean with alcohol swab Apply antibiotic ointment Cover with clean bandage Warning Signs - Seek Immediate Care Red streaking up the finger or hand Fever or chills Increasing pain, swelling, or redness Pus or significant discharge Numbness or tingling Follow-up Care Return in 2-3 weeks if no improvement Return sooner if any warning signs develop Call if you have questions about the  process What to Expect Gradual softening of the area over 1-2 weeks Core may become more visible or prominent Some patients need multiple gentle attempts Relief should occur once foreign body is removed Important Notes Be patient - this process takes time Don't force it - aggressive attempts can push material deeper Keep area clean to prevent infection Complete antibiotic course even if improving

## 2023-12-06 ENCOUNTER — Encounter: Payer: Medicare Other | Admitting: Internal Medicine

## 2023-12-10 DIAGNOSIS — D492 Neoplasm of unspecified behavior of bone, soft tissue, and skin: Secondary | ICD-10-CM | POA: Diagnosis not present

## 2023-12-10 DIAGNOSIS — K130 Diseases of lips: Secondary | ICD-10-CM | POA: Diagnosis not present

## 2023-12-10 DIAGNOSIS — L280 Lichen simplex chronicus: Secondary | ICD-10-CM | POA: Diagnosis not present

## 2023-12-10 DIAGNOSIS — L570 Actinic keratosis: Secondary | ICD-10-CM | POA: Diagnosis not present

## 2023-12-10 DIAGNOSIS — L821 Other seborrheic keratosis: Secondary | ICD-10-CM | POA: Diagnosis not present

## 2024-02-27 DIAGNOSIS — M62830 Muscle spasm of back: Secondary | ICD-10-CM | POA: Diagnosis not present

## 2024-02-27 DIAGNOSIS — M5137 Other intervertebral disc degeneration, lumbosacral region with discogenic back pain only: Secondary | ICD-10-CM | POA: Diagnosis not present

## 2024-03-09 DIAGNOSIS — M545 Low back pain, unspecified: Secondary | ICD-10-CM | POA: Diagnosis not present

## 2024-03-11 DIAGNOSIS — K130 Diseases of lips: Secondary | ICD-10-CM | POA: Diagnosis not present
